# Patient Record
Sex: Male | Born: 1965 | Race: White | Hispanic: No | Marital: Married | Smoking: Former smoker
Health system: Southern US, Community
[De-identification: ages and names within clinical notes are randomized; demographics above are authoritative.]

## PROBLEM LIST (undated history)

## (undated) DIAGNOSIS — S82141A Displaced bicondylar fracture of right tibia, initial encounter for closed fracture: Secondary | ICD-10-CM

## (undated) HISTORY — PX: APPENDECTOMY: SHX54

---

## 1998-01-16 ENCOUNTER — Inpatient Hospital Stay (HOSPITAL_COMMUNITY): Admission: EM | Admit: 1998-01-16 | Discharge: 1998-01-17 | Payer: Self-pay | Admitting: Emergency Medicine

## 1998-01-16 ENCOUNTER — Encounter: Payer: Self-pay | Admitting: Emergency Medicine

## 1998-11-28 ENCOUNTER — Emergency Department (HOSPITAL_COMMUNITY): Admission: EM | Admit: 1998-11-28 | Discharge: 1998-11-28 | Payer: Self-pay | Admitting: Emergency Medicine

## 1998-11-28 ENCOUNTER — Encounter: Payer: Self-pay | Admitting: Emergency Medicine

## 2005-08-17 ENCOUNTER — Inpatient Hospital Stay (HOSPITAL_COMMUNITY): Admission: EM | Admit: 2005-08-17 | Discharge: 2005-08-18 | Payer: Self-pay | Admitting: Emergency Medicine

## 2012-01-09 ENCOUNTER — Emergency Department (HOSPITAL_COMMUNITY)
Admission: EM | Admit: 2012-01-09 | Discharge: 2012-01-09 | Disposition: A | Discharge status: Home / Self Care | Payer: Self-pay | Attending: Emergency Medicine | Admitting: Emergency Medicine

## 2012-01-09 ENCOUNTER — Encounter (HOSPITAL_COMMUNITY): Payer: Self-pay

## 2012-01-09 DIAGNOSIS — S61209A Unspecified open wound of unspecified finger without damage to nail, initial encounter: Secondary | ICD-10-CM | POA: Insufficient documentation

## 2012-01-09 DIAGNOSIS — W230XXA Caught, crushed, jammed, or pinched between moving objects, initial encounter: Secondary | ICD-10-CM | POA: Insufficient documentation

## 2012-01-09 DIAGNOSIS — Y929 Unspecified place or not applicable: Secondary | ICD-10-CM | POA: Insufficient documentation

## 2012-01-09 DIAGNOSIS — S61219A Laceration without foreign body of unspecified finger without damage to nail, initial encounter: Secondary | ICD-10-CM

## 2012-01-09 DIAGNOSIS — Y939 Activity, unspecified: Secondary | ICD-10-CM | POA: Insufficient documentation

## 2012-01-09 DIAGNOSIS — Z23 Encounter for immunization: Secondary | ICD-10-CM | POA: Insufficient documentation

## 2012-01-09 MED ORDER — TETANUS-DIPHTH-ACELL PERTUSSIS 5-2.5-18.5 LF-MCG/0.5 IM SUSP
0.5000 mL | Freq: Once | INTRAMUSCULAR | Status: AC
  Administered 2012-01-09: 0.5 mL via INTRAMUSCULAR
  Filled 2012-01-09: qty 0.5

## 2012-01-09 NOTE — ED Notes (Signed)
Pt got ring finger caught in ladder laceration to the underside of left right finger, swelling, bleeding controlled

## 2012-01-09 NOTE — ED Provider Notes (Signed)
History     CSN: [id number]  Arrival date & time 01/09/12  4696   First MD Initiated Contact with Patient 01/09/12 [phone]      Chief Complaint  Patient presents with  . Hand Injury    (Consider location/radiation/quality/duration/timing/severity/associated sxs/prior treatment) HPI Comments: 46 year old male presents the emergency department with a laceration to his left ring finger after having his finger caught on a ladder about an hour and a half ago. The ladder latched on to his ring causing it to cut his finger. States his finger is getting a little tingly but not numb yet. Denies nausea or lightheadedness. He was able to control the bleeding. Last tetanus shot over 10 years ago.  Patient is a 46 y.o. male presenting with hand injury. The history is provided by the patient and the spouse.  Hand Injury     History reviewed. No pertinent past medical history.  Past Surgical History  Procedure Date  . Appendectomy     History reviewed. No pertinent family history.  History  Substance Use Topics  . Smoking status: Never Smoker   . Smokeless tobacco: Not on file  . Alcohol Use: Yes      Review of Systems  Gastrointestinal: Negative for nausea.  Skin: Positive for wound.  Neurological: Negative for light-headedness.  Hematological: Does not bruise/bleed easily.    Allergies  Review of patient's allergies indicates no known allergies.  Home Medications  No current outpatient prescriptions on file.  BP 124/74  Pulse 72  Temp 97.8 F (36.6 C) (Oral)  Resp 20  SpO2 99%  Physical Exam  Constitutional: He is oriented to person, place, and time. He appears well-developed and well-nourished. No distress.  HENT:  Head: Normocephalic and atraumatic.  Eyes: Conjunctivae normal are normal.  Neck: Normal range of motion.  Cardiovascular: Normal rate, regular rhythm and normal heart sounds.   Pulmonary/Chest: Effort normal and breath sounds normal.  Musculoskeletal:         Hands: Neurological: He is alert and oriented to person, place, and time. He has normal strength. No sensory deficit.  Skin:       See MSS exam findings  Psychiatric: He has a normal mood and affect. His behavior is normal.    ED Course  Procedures (including critical care time) LACERATION REPAIR Performed by: Johnnette Gourd Authorized by: Johnnette Gourd Consent: Verbal consent obtained. Risks and benefits: risks, benefits and alternatives were discussed Consent given by: patient Patient identity confirmed: provided demographic data Prepped and Draped in normal sterile fashion Wound explored  Laceration Location: left 4th digit  Laceration Length: 1.5 cm  No Foreign Bodies seen or palpated  Anesthesia: local infiltration  Local anesthetic: lidocaine 2% without epinephrine  Anesthetic total: 3 ml  Irrigation method: syringe Amount of cleaning: standard  Skin closure: 4-0 prolene  Number of sutures: 7  Technique: simple interrupted  Patient tolerance: Patient tolerated the procedure well with no immediate complications.  Labs Reviewed - No data to display No results found.   1. Finger laceration       MDM  46 y/o male with finger laceration. Wedding band removed. Tdap given. No evidence of tendinous disruption. 7 sutures placed without problem. Proper wound care discussed.         Trevor Mace, PA-C 01/09/12 1036

## 2012-01-09 NOTE — ED Notes (Signed)
Wedding ring cut off. Two pieces cleaned and given to patient's wife. Patient tolerated well.

## 2012-01-09 NOTE — ED Provider Notes (Signed)
Medical screening examination/treatment/procedure(s) were performed by non-physician practitioner and as supervising physician I was immediately available for consultation/collaboration.   Celene Kras, MD 01/09/12 1040

## 2012-01-20 ENCOUNTER — Emergency Department (HOSPITAL_COMMUNITY)
Admission: EM | Admit: 2012-01-20 | Discharge: 2012-01-20 | Disposition: A | Discharge status: Home / Self Care | Payer: Self-pay | Source: Home / Self Care

## 2013-11-04 ENCOUNTER — Emergency Department (HOSPITAL_COMMUNITY)
Admission: EM | Admit: 2013-11-04 | Discharge: 2013-11-04 | Disposition: A | Discharge status: Home / Self Care | Payer: BC Managed Care – PPO | Attending: Emergency Medicine | Admitting: Emergency Medicine

## 2013-11-04 ENCOUNTER — Encounter (HOSPITAL_COMMUNITY): Payer: Self-pay | Admitting: Emergency Medicine

## 2013-11-04 DIAGNOSIS — M25439 Effusion, unspecified wrist: Secondary | ICD-10-CM | POA: Diagnosis present

## 2013-11-04 DIAGNOSIS — Z79899 Other long term (current) drug therapy: Secondary | ICD-10-CM | POA: Insufficient documentation

## 2013-11-04 DIAGNOSIS — M71022 Abscess of bursa, left elbow: Secondary | ICD-10-CM

## 2013-11-04 DIAGNOSIS — M6789 Other specified disorders of synovium and tendon, multiple sites: Secondary | ICD-10-CM | POA: Insufficient documentation

## 2013-11-04 MED ORDER — PREDNISONE 20 MG PO TABS
60.0000 mg | ORAL_TABLET | Freq: Once | ORAL | Status: AC
  Administered 2013-11-04: 60 mg via ORAL
  Filled 2013-11-04: qty 3

## 2013-11-04 MED ORDER — PREDNISONE 10 MG PO TABS: 20.0000 mg | ORAL_TABLET | Freq: Every day | ORAL | Status: DC

## 2013-11-04 NOTE — ED Notes (Signed)
Pt reports that on July 1st he was working outside and a wasp stung him on his left below. Pt reports that he had a large area of swelling afterwards which lasted for three days. Pt reports that there has had some mild swelling since. Pt states that today the area began to re-swell. Pt is A/O x4, in NAD, and vitals are WDL.

## 2013-11-04 NOTE — ED Provider Notes (Signed)
CSN: [id number]     Arrival date & time 11/04/13  1917 History   First MD Initiated Contact with Patient 11/04/13 1925    This chart was scribed for nurse practitioner Junius Creamer, NP working with Threasa Beards, MD, by Thea Alken, ED Scribe. This patient was seen in room [facility] and the patient's care was started at 7:51 PM. Chief Complaint  Patient presents with  . Joint Swelling   The history is provided by the patient. No language interpreter was used.   Gordon Johnson is a 48 y.o. male who presents to the Emergency Department complaining of left elbow swelling. Pt reports he was stung by a wasp to his left elbow about 2 month ago but has had persisting swelling. States the swelling went subsided for some time but worsened today. Pt denies pain to elbow. Pt is a Nature conservation officer.  History reviewed. No pertinent past medical history. Past Surgical History  Procedure Laterality Date  . Appendectomy     No family history on file. History  Substance Use Topics  . Smoking status: Never Smoker   . Smokeless tobacco: Not on file  . Alcohol Use: Yes    Review of Systems  Constitutional: Negative for fever and chills.  Musculoskeletal: Positive for joint swelling.  Skin: Negative for wound.  Neurological: Negative for numbness.  All other systems reviewed and are negative.   Allergies  Review of patient's allergies indicates no known allergies.  Home Medications   Prior to Admission medications   Medication Sig Start Date End Date Taking? Authorizing Provider  predniSONE (DELTASONE) 10 MG tablet Take 2 tablets (20 mg total) by mouth daily. 11/04/13   Garald Balding, NP   BP 136/78  Pulse 84  Temp(Src) 98.5 F (36.9 C) (Oral)  Resp 18  SpO2 100% Physical Exam  Nursing note and vitals reviewed. Constitutional: He is oriented to person, place, and time. He appears well-developed and well-nourished. No distress.  HENT:  Head: Normocephalic and atraumatic.  Eyes:  Conjunctivae and EOM are normal.  Neck: Neck supple.  Cardiovascular: Normal rate and intact distal pulses.   Pulmonary/Chest: Effort normal.  Musculoskeletal: Normal range of motion.       Left elbow: He exhibits swelling. He exhibits normal range of motion, no effusion, no deformity and no laceration. No olecranon process tenderness noted.  Swelling over the olecranon process without erythema or pain   Neurological: He is alert and oriented to person, place, and time.  Skin: Skin is warm and dry. No erythema.  Bursa to the left elbow. No increased heat.   Psychiatric: He has a normal mood and affect. His behavior is normal.    ED Course  Procedures  DIAGNOSTIC STUDIES: Oxygen Saturation is 100% on RA, normal by my interpretation.    COORDINATION OF CARE: 8:10 PM- Pt advised of plan for treatment and pt agrees.  Labs Review Labs Reviewed - No data to display  Imaging Review No results found.   EKG Interpretation None      MDM   Final diagnoses:  Olecranon bursa abscess, left    I personally performed the services described in this documentation, which was scribed in my presence. The recorded information has been reviewed and is accurate.      Garald Balding, NP 11/04/13 2010

## 2013-11-04 NOTE — ED Provider Notes (Signed)
Medical screening examination/treatment/procedure(s) were performed by non-physician practitioner and as supervising physician I was immediately available for consultation/collaboration.   EKG Interpretation None       Threasa Beards, MD 11/04/13 2016

## 2014-10-18 ENCOUNTER — Emergency Department (HOSPITAL_COMMUNITY): Payer: BLUE CROSS/BLUE SHIELD

## 2014-10-18 ENCOUNTER — Emergency Department (HOSPITAL_COMMUNITY)
Admission: EM | Admit: 2014-10-18 | Discharge: 2014-10-18 | Disposition: A | Discharge status: Home / Self Care | Payer: BLUE CROSS/BLUE SHIELD | Attending: Emergency Medicine | Admitting: Emergency Medicine

## 2014-10-18 ENCOUNTER — Encounter (HOSPITAL_COMMUNITY): Payer: Self-pay | Admitting: *Deleted

## 2014-10-18 DIAGNOSIS — Y998 Other external cause status: Secondary | ICD-10-CM | POA: Diagnosis not present

## 2014-10-18 DIAGNOSIS — Z87891 Personal history of nicotine dependence: Secondary | ICD-10-CM | POA: Insufficient documentation

## 2014-10-18 DIAGNOSIS — Y9289 Other specified places as the place of occurrence of the external cause: Secondary | ICD-10-CM | POA: Diagnosis not present

## 2014-10-18 DIAGNOSIS — W11XXXA Fall on and from ladder, initial encounter: Secondary | ICD-10-CM | POA: Insufficient documentation

## 2014-10-18 DIAGNOSIS — S8992XA Unspecified injury of left lower leg, initial encounter: Secondary | ICD-10-CM | POA: Insufficient documentation

## 2014-10-18 DIAGNOSIS — Z7952 Long term (current) use of systemic steroids: Secondary | ICD-10-CM | POA: Diagnosis not present

## 2014-10-18 DIAGNOSIS — S82101A Unspecified fracture of upper end of right tibia, initial encounter for closed fracture: Secondary | ICD-10-CM | POA: Insufficient documentation

## 2014-10-18 DIAGNOSIS — S8991XA Unspecified injury of right lower leg, initial encounter: Secondary | ICD-10-CM | POA: Diagnosis present

## 2014-10-18 DIAGNOSIS — Y9389 Activity, other specified: Secondary | ICD-10-CM | POA: Insufficient documentation

## 2014-10-18 DIAGNOSIS — S82141A Displaced bicondylar fracture of right tibia, initial encounter for closed fracture: Secondary | ICD-10-CM

## 2014-10-18 MED ORDER — MORPHINE SULFATE (PF) 4 MG/ML IV SOLN
4.0000 mg | Freq: Once | INTRAVENOUS | Status: AC
  Administered 2014-10-18: 4 mg via INTRAMUSCULAR
  Filled 2014-10-18: qty 1

## 2014-10-18 MED ORDER — METHOCARBAMOL 500 MG PO TABS: 500.0000 mg | ORAL_TABLET | Freq: Two times a day (BID) | ORAL | Status: DC

## 2014-10-18 MED ORDER — NAPROXEN 500 MG PO TABS
500.0000 mg | ORAL_TABLET | Freq: Once | ORAL | Status: AC
  Administered 2014-10-18: 500 mg via ORAL
  Filled 2014-10-18: qty 1

## 2014-10-18 MED ORDER — OXYCODONE-ACETAMINOPHEN 5-325 MG PO TABS: 2.0000 | ORAL_TABLET | ORAL | Status: DC | PRN

## 2014-10-18 MED ORDER — NAPROXEN 500 MG PO TABS: 500.0000 mg | ORAL_TABLET | Freq: Two times a day (BID) | ORAL | Status: DC

## 2014-10-18 NOTE — ED Notes (Signed)
Pt reports he fell 3 ft from a ladder, but as he was falling, his R leg got caught in the ladder.  Pt reports R knee pain, unable to straighten his R knee.  He states he heard a "pop."

## 2014-10-18 NOTE — ED Provider Notes (Signed)
CSN: [id number]     Arrival date & time 10/18/14  1529 History  This chart was scribed for non-physician practitioner, Ottie Glazier, PA-C working with Dorie Rank, MD by Rayna Sexton, ED scribe. This patient was seen in room WTR8/WTR8 and the patient's care was started at 4:10 PM.   Chief Complaint  Patient presents with  . Fall  . Knee Pain   The history is provided by the patient. No language interpreter was used.    HPI Comments: Gordon Johnson is a 49 y.o. male who presents to the Emergency Department complaining of a fall that occurred PTA. Pt notes falling from a 63ft ladder and before hitting the ground further notes his right leg caught between the steps in the ladder resulting in a "pop" in his right knee. He notes associated, moderate, pain and swelling that worsens with movement and a decreased ROM to the affected knee. 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 He denies any surgery to the affected knee. Pt denies any numbness.   History reviewed. No pertinent past medical history. Past Surgical History  Procedure Laterality Date  . Appendectomy     No family history on file. Social History  Substance Use Topics  . Smoking status: Former Research scientist (life sciences)  . Smokeless tobacco: None  . Alcohol Use: Yes    Review of Systems  Musculoskeletal: Positive for joint swelling and arthralgias.  Skin: Negative for color change and wound.  Neurological: Negative for numbness.   Allergies  Review of patient's allergies indicates no known allergies.  Home Medications   Prior to Admission medications   Medication Sig Start Date End Date Taking? Authorizing Provider  oxyCODONE-acetaminophen (PERCOCET/ROXICET) 5-325 MG per tablet Take 2 tablets by mouth every 4 (four) hours as needed for severe pain. 10/18/14   Lorenda Grecco Patel-Mills, PA-C  predniSONE (DELTASONE) 10 MG tablet Take 2 tablets (20 mg total) by mouth daily. 11/04/13   Junius Creamer, NP   BP 113/72 mmHg   Pulse 66  Temp(Src) 97.9 F (36.6 C) (Oral)  Resp 18  SpO2 97% Physical Exam  Constitutional: He is oriented to person, place, and time. He appears well-developed and well-nourished. No distress.  HENT:  Head: Normocephalic and atraumatic.  Mouth/Throat: No oropharyngeal exudate.  Eyes: Right eye exhibits no discharge. Left eye exhibits no discharge.  Neck: Normal range of motion. No tracheal deviation present.  Cardiovascular: Normal rate and intact distal pulses.   Pulses:      Dorsalis pedis pulses are 2+ on the right side.  Pulmonary/Chest: Effort normal. No respiratory distress.  Abdominal: Soft. There is no tenderness.  Musculoskeletal:       Left knee: He exhibits decreased range of motion, swelling and effusion. He exhibits no ecchymosis and no erythema. Tenderness found.  Moderate joint effusion to the right knee; unable to straight leg raise; less than 2 second capillary refill; NVI  Neurological: He is alert and oriented to person, place, and time.  Skin: Skin is warm and dry. He is not diaphoretic.  Psychiatric: He has a normal mood and affect. His behavior is normal.  Nursing note and vitals reviewed.  ED Course  Procedures  DIAGNOSTIC STUDIES: Oxygen Saturation is 100% on RA, normal by my interpretation.    COORDINATION OF CARE: 4:14 PM Discussed treatment plan with pt at bedside including an x-ray to the right knee and a dosage of pain medication. Pt agreed to plan.  Labs Review Labs Reviewed - No data to display  Imaging Review Dg Knee Complete 4 Views Right  10/18/2014   CLINICAL DATA:  Pain following fall  EXAM: RIGHT KNEE - COMPLETE 4+ VIEW  COMPARISON:  None.  FINDINGS: Frontal, lateral, and bilateral oblique views were obtained. There is a comminuted fracture of the medial tibial plateaus with several displaced fracture fragments. No other fractures. No dislocation. There is a fairly sizable joint effusion. No erosive change.  IMPRESSION: Comminuted fracture  of the medial tibial plateau with several displaced fracture fragments. Moderate joint effusion. No dislocation.   Electronically Signed   By: Lowella Grip III M.D.   On: 10/18/2014 16:12   I have personally reviewed and evaluated these images and lab results as part of my medical decision-making.   EKG Interpretation None      MDM   Final diagnoses:  Tibial plateau fracture, right, closed, initial encounter  Patient presents for right knee pain after fall from a ladder. X-ray of the knee shows comminuted fracture of the medial tibial plateau with several displaced fracture fragments. He has a moderate joint effusion but no dislocation.I discussed this patient with Dr. Trevor Mace PA who recommended CT of the knee to determine if this was surgical or not. He also recommended a knee immobilizer, to caution the patient about compartment syndrome, and to keep the patient nonweightbearing until he is evaluated by orthopedics. I will also give the patient Percocet for pain. I discussed the plan with the patient and he verbally agrees.  I personally performed the services described in this documentation, which was scribed in my presence. The recorded information has been reviewed and is accurate.   Ottie Glazier, PA-C 10/18/14 1743  Dorie Rank, MD 10/21/14 [id number]

## 2014-10-18 NOTE — ED Notes (Addendum)
See triage note.  Hematoma noted in medial R knee.

## 2014-10-18 NOTE — Discharge Instructions (Signed)
Tibial Fracture, Adult Follow up with orthopedics on Monday. You have a fracture (break in bone) of your tibia. This is the large "shin" bone in your lower leg. These fractures are easily diagnosed with x-rays. TREATMENT  You have a simple fracture which usually will heal without disability. It can be treated with simple immobilization. This means the bone can be held with a cast or splint in a favorable position until your caregiver feels it is stable (healed well enough). Then you can begin range of motion exercises to keep your knee and ankle limber (moving well). HOME CARE INSTRUCTIONS   Apply ice to the injury for 15-20 minutes, 03-04 times per day while awake, for 2 days. Put the ice in a plastic bag and place a thin towel between the bag of ice and your cast.  If you have a plaster or fiberglass cast:  Do not try to scratch the skin under the cast using sharp or pointed objects.  Check the skin around the cast every day. You may put lotion on any red or sore areas.  Keep your cast dry and clean.  If you have a plaster splint:  Wear the splint as directed.  You may loosen the elastic around the splint if your toes become numb, tingle, or turn cold or blue.  Do not put pressure on any part of your cast or splint until it is fully hardened.  Your cast or splint can be protected during bathing with a plastic bag. Do not lower the cast or splint into water.  Use crutches as directed.  Only take over-the-counter or prescription medicines for pain, discomfort, or fever as directed by your caregiver.  See your caregiver as directed. It is very important to keep all follow-up referrals and appointments in order to avoid any long-term problems with your leg and ankle including chronic pain, inability to move the ankle normally, failure of the fracture to heal and permanent disability. SEEK IMMEDIATE MEDICAL CARE IF:   Pain is becoming worse rather than better, or if pain is  uncontrolled with medications.  You have increased swelling, pain, or redness in the foot.  You begin to lose feeling in your foot or toes.  You develop a cold or blue foot or toes on the injured side.  You develop severe pain in your injured leg, especially if it is increased with movement of your toes. Document Released: 11/11/2000 Document Revised: 05/11/2011 Document Reviewed: 04/12/2013 [facility] Patient Information 2015 [place], [place]. This information is not intended to replace advice given to you by your health care provider. Make sure you discuss any questions you have with your health care provider.   Knee Bracing Knee braces are supports to help stabilize and protect an injured or painful knee. They come in many different styles. They should support and protect the knee without increasing the chance of other injuries to yourself or others. It is important not to have a false sense of security when using a brace. Knee braces that help you to keep using your knee:  Do not restore normal knee stability under high stress forces.  May decrease some aspects of athletic performance. Some of the different types of knee braces are:  Prophylactic knee braces are designed to prevent or reduce the severity of knee injuries during sports that make injury to the knee more likely.  Rehabilitative knee braces are designed to allow protected motion of:  Injured knees.  Knees that have been treated with or without surgery.  There is no evidence that the use of a supportive knee brace protects the graft following a successful anterior cruciate ligament (ACL) reconstruction. However, braces are sometimes used to:   Protect injured ligaments.  Control knee movement during the initial healing period. They may be used as part of the treatment program for the various injured ligaments or cartilage of the knee including the:  Anterior cruciate ligament.  Medial collateral ligament.  Medial or  lateral cartilage (meniscus).  Posterior cruciate ligament.  Lateral collateral ligament. Rehabilitative knee braces are most commonly used:  During crutch-assisted walking right after injury.  During crutch-assisted walking right after surgery to repair the cartilage and/or cruciate ligament injury.  For a short period of time, 2-8 weeks, after the injury or surgery. The value of a rehabilitative brace as opposed to a cast or splint includes the:  Ability to adjust the brace for swelling.  Ability to remove the brace for examinations, icing, or showering.  Ability to allow for movement in a controlled range of motion. Functional knee braces give support to knees that have already been injured. They are designed to provide stability for the injured knee and provide protection after repair. Functional knee braces may not affect performance much. Lower extremity muscle strengthening, flexibility, and improvement in technique are more important than bracing in treating ligamentous knee injuries. Functional braces are not a substitute for rehabilitation or surgical procedures. Unloader/off-loader braces are designed to provide pain relief in arthritic knees. Patients with wear and tear arthritis from growing old or from an old cartilage injury (osteoarthritis) of the knee, and bowlegged (varus) or knock-knee (valgus) deformities, often develop increased pain in the arthritic side due to increased loading. Unloader/off-loader braces are made to reduce uneven loading in such knees. There is reduction in bowing out movement in bowlegged knees when the correct unloader brace is used. Patients with advanced osteoarthritis or severe varus or valgus alignment problems would not likely benefit from bracing. Patellofemoral braces help the kneecap to move smoothly and well centered over the end of the femur in the knee.  Most people who wear knee braces feel that they help. However, there is a lack of  scientific evidence that knee braces are helpful at the level needed for athletic participation to prevent injury. In spite of this, athletes report an increase in knee stability, pain relief, performance improvement, and confidence during athletics when using a brace.  Different knee problems require different knee braces:  Your caregiver may suggest one kind of knee brace after knee surgery.  A caregiver may choose another kind of knee brace for support instead of surgery for some types of torn ligaments.  You may also need one for pain in the front of your knee that is not getting better with strengthening and flexibility exercises. Get your caregiver's advice if you want to try a knee brace. The caregiver will advise you on where to get them and provide a prescription when it is needed to fashion and/or fit the brace. Knee braces are the least important part of preventing knee injuries or getting better following injury. Stretching, strengthening and technique improvement are far more important in caring for and preventing knee injuries. When strengthening your knee, increase your activities a little at a time so as not to develop injuries from overuse. Work out an exercise plan with your caregiver and/or physical therapist to get the best program for you. Do not let a knee brace become a crutch. Always remember, there are no  braces which support the knee as well as your original ligaments and cartilage you were born with. Conditioning, proper warm-up, and stretching remain the most important parts of keeping your knees healthy. HOW TO USE A KNEE BRACE  During sports, knee braces should be used as directed by your caregiver.  Make sure that the hinges are where the knee bends.  Straps, tapes, or hook-and-loop tapes should be fastened around your leg as instructed.  You should check the placement of the brace during activities to make sure that it has not moved. Poorly positioned braces can  hurt rather than help you.  To work well, a knee brace should be worn during all activities that put you at risk of knee injury.  Warm up properly before beginning athletic activities. HOME CARE INSTRUCTIONS  Knee braces often get damaged during normal use. Replace worn-out braces for maximum benefit.  Clean regularly with soap and water.  Inspect your brace often for wear and tear.  Cover exposed metal to protect others from injury.  Durable materials may cost more, but last longer. SEEK IMMEDIATE MEDICAL CARE IF:   Your knee seems to be getting worse rather than better.  You have increasing pain or swelling in the knee.  You have problems caused by the knee brace.  You have increased swelling or inflammation (redness or soreness) in your knee.  Your knee becomes warm and more painful and you develop an unexplained temperature over 101F (38.3C). MAKE SURE YOU:   Understand these instructions.  Will watch your condition.  Will get help right away if you are not doing well or get worse. See your caregiver, physical therapist, or orthopedic surgeon for additional information. Document Released: 05/09/2003 Document Revised: 07/03/2013 Document Reviewed: 08/15/2008 [facility] Patient Information 2015 [place], [place]. This information is not intended to replace advice given to you by your health care provider. Make sure you discuss any questions you have with your health care provider.

## 2014-10-23 NOTE — H&P (Signed)
  ORTHOPAEDIC HISTORY & PHYSICAL  Gordon Johnson MRN:  [id number] DOB/SEX:  March 02, 1966/male  CHIEF COMPLAINT:  Painful right TIBIAL PLATAEU  HISTORY:REASON FOR VISIT:  New patient with a right tibial plateau fracture referred from [facility] Date of injury:  10/18/2014.         He was on a step-ladder when he fell off landing on his right leg feeling immediate pop and pain.  He had x-rays and CT scan which demonstrated medial sided isolated tibial plateau fracture.        PAST MEDICAL HISTORY: There are no active problems to display for this patient.  No past medical history on file. Past Surgical History  Procedure Laterality Date  . Appendectomy       MEDICATIONS:   No prescriptions prior to admission    ALLERGIES:  No Known Allergies  REVIEW OF SYSTEMS:  Musculoskeletal:positive for His compartments are soft. Minimal ecchymosis with mild swelling.  Distally he is neurovascularly intact. Patient confirms pain to all movement right leg/knee.   FAMILY HISTORY:  No family history on file.  SOCIAL HISTORY:   Social History  Substance Use Topics  . Smoking status: Former Research scientist (life sciences)  . Smokeless tobacco: Not on file  . Alcohol Use: Yes      EXAMINATION: Vital signs in last 24 hours:    Head is normocephalic.   Eyes:  Pupils equal, round and reactive to light and accommodation.  Extraocular intact. ENT: Ears, nose, and throat were benign.   Neck: supple, no bruits were noted.   Chest: good expansion.   Lungs: essentially clear.   Cardiac: regular rhythm and rate, normal S1, S2.  No murmurs appreciated. Pulses :  2+ bilateral and symmetric in bilateral extremities. Abdomen is scaphoid, soft, nontender, no masses palpable, normal bowel sounds                  present. CNS:  He is oriented x3 and cranial nerves II-XII grossly intact. Breast, rectal, and genital exams: not performed and not indicated for an orthopedic evaluation. Musculoskeletal: Musculoskeletal:positive  for His compartments are soft. Minimal ecchymosis with mild swelling.  Distally he is neurovascularly intact.    Imaging Review  medial plateau fracture with significant displacement.   ASSESSMENT: right tibial plateau fracture  No past medical history on file.  PLAN: Plan for right tibial plateau open reduction internal fixation  The procedure,  risks, and benefits of surgery were presented and reviewed. The risks including but not limited to infection, blood clots, vascular and nerve injury, stiffness,  among others were discussed. The patient acknowledged the explanation, agreed to proceed.   Buck Mam, PA-C  [phone]  10/23/2014 5:46 PM

## 2014-10-24 ENCOUNTER — Encounter (HOSPITAL_BASED_OUTPATIENT_CLINIC_OR_DEPARTMENT_OTHER): Payer: Self-pay | Admitting: *Deleted

## 2014-10-25 ENCOUNTER — Ambulatory Visit (HOSPITAL_BASED_OUTPATIENT_CLINIC_OR_DEPARTMENT_OTHER): Payer: BLUE CROSS/BLUE SHIELD | Admitting: Anesthesiology

## 2014-10-25 ENCOUNTER — Encounter (HOSPITAL_BASED_OUTPATIENT_CLINIC_OR_DEPARTMENT_OTHER): Payer: Self-pay | Admitting: Anesthesiology

## 2014-10-25 ENCOUNTER — Ambulatory Visit (HOSPITAL_COMMUNITY): Payer: BLUE CROSS/BLUE SHIELD

## 2014-10-25 ENCOUNTER — Encounter (HOSPITAL_BASED_OUTPATIENT_CLINIC_OR_DEPARTMENT_OTHER)
Admission: RE | Disposition: A | Discharge status: Home / Self Care | Payer: Self-pay | Source: Ambulatory Visit | Attending: Orthopedic Surgery

## 2014-10-25 ENCOUNTER — Ambulatory Visit (HOSPITAL_BASED_OUTPATIENT_CLINIC_OR_DEPARTMENT_OTHER)
Admission: RE | Admit: 2014-10-25 | Discharge: 2014-10-26 | Disposition: A | Discharge status: Home / Self Care | Payer: BLUE CROSS/BLUE SHIELD | Source: Ambulatory Visit | Attending: Orthopedic Surgery | Admitting: Orthopedic Surgery

## 2014-10-25 DIAGNOSIS — S83206A Unspecified tear of unspecified meniscus, current injury, right knee, initial encounter: Secondary | ICD-10-CM | POA: Insufficient documentation

## 2014-10-25 DIAGNOSIS — S82209A Unspecified fracture of shaft of unspecified tibia, initial encounter for closed fracture: Secondary | ICD-10-CM | POA: Diagnosis present

## 2014-10-25 DIAGNOSIS — S82141A Displaced bicondylar fracture of right tibia, initial encounter for closed fracture: Secondary | ICD-10-CM | POA: Insufficient documentation

## 2014-10-25 DIAGNOSIS — X58XXXA Exposure to other specified factors, initial encounter: Secondary | ICD-10-CM | POA: Diagnosis not present

## 2014-10-25 DIAGNOSIS — Z4789 Encounter for other orthopedic aftercare: Secondary | ICD-10-CM

## 2014-10-25 DIAGNOSIS — Z87891 Personal history of nicotine dependence: Secondary | ICD-10-CM | POA: Insufficient documentation

## 2014-10-25 HISTORY — PX: ORIF TIBIA PLATEAU: SHX2132

## 2014-10-25 HISTORY — DX: Displaced bicondylar fracture of right tibia, initial encounter for closed fracture: S82.141A

## 2014-10-25 LAB — POCT HEMOGLOBIN-HEMACUE: HEMOGLOBIN: 16.3 g/dL (ref 13.0–17.0)

## 2014-10-25 SURGERY — OPEN REDUCTION INTERNAL FIXATION (ORIF) TIBIAL PLATEAU
Anesthesia: General | Laterality: Right

## 2014-10-25 MED ORDER — HYDROMORPHONE HCL 1 MG/ML IJ SOLN
INTRAMUSCULAR | Status: AC
  Filled 2014-10-25: qty 1

## 2014-10-25 MED ORDER — OXYCODONE HCL 5 MG PO TABS
5.0000 mg | ORAL_TABLET | ORAL | Status: DC | PRN
  Administered 2014-10-25 – 2014-10-26 (×6): 10 mg via ORAL
  Filled 2014-10-25 (×6): qty 2

## 2014-10-25 MED ORDER — HYDROMORPHONE HCL 1 MG/ML IJ SOLN
0.2500 mg | INTRAMUSCULAR | Status: DC | PRN
  Administered 2014-10-25 (×3): 0.5 mg via INTRAVENOUS

## 2014-10-25 MED ORDER — MIDAZOLAM HCL 2 MG/2ML IJ SOLN
INTRAMUSCULAR | Status: AC
  Filled 2014-10-25: qty 2

## 2014-10-25 MED ORDER — METOCLOPRAMIDE HCL 5 MG/ML IJ SOLN: 5.0000 mg | Freq: Three times a day (TID) | INTRAMUSCULAR | Status: DC | PRN

## 2014-10-25 MED ORDER — DEXAMETHASONE SODIUM PHOSPHATE 10 MG/ML IJ SOLN
INTRAMUSCULAR | Status: DC | PRN
  Administered 2014-10-25: 10 mg via INTRAVENOUS

## 2014-10-25 MED ORDER — GLYCOPYRROLATE 0.2 MG/ML IJ SOLN: 0.2000 mg | Freq: Once | INTRAMUSCULAR | Status: DC | PRN

## 2014-10-25 MED ORDER — CHLORHEXIDINE GLUCONATE 4 % EX LIQD: 60.0000 mL | Freq: Once | CUTANEOUS | Status: DC

## 2014-10-25 MED ORDER — SCOPOLAMINE 1 MG/3DAYS TD PT72: 1.0000 | MEDICATED_PATCH | Freq: Once | TRANSDERMAL | Status: DC | PRN

## 2014-10-25 MED ORDER — DOCUSATE SODIUM 100 MG PO CAPS: 100.0000 mg | ORAL_CAPSULE | Freq: Two times a day (BID) | ORAL | Status: DC

## 2014-10-25 MED ORDER — HYDROMORPHONE HCL 1 MG/ML IJ SOLN
1.0000 mg | INTRAMUSCULAR | Status: DC | PRN
  Administered 2014-10-25 (×2): 1 mg via INTRAVENOUS
  Administered 2014-10-25 (×2): 0.5 mg via INTRAVENOUS
  Administered 2014-10-26 (×2): 1 mg via INTRAVENOUS
  Filled 2014-10-25 (×5): qty 1

## 2014-10-25 MED ORDER — METOCLOPRAMIDE HCL 5 MG PO TABS: 5.0000 mg | ORAL_TABLET | Freq: Three times a day (TID) | ORAL | Status: DC | PRN

## 2014-10-25 MED ORDER — 0.9 % SODIUM CHLORIDE (POUR BTL) OPTIME
TOPICAL | Status: DC | PRN
  Administered 2014-10-25: 300 mL

## 2014-10-25 MED ORDER — ONDANSETRON HCL 4 MG/2ML IJ SOLN
INTRAMUSCULAR | Status: DC | PRN
  Administered 2014-10-25: 4 mg via INTRAVENOUS

## 2014-10-25 MED ORDER — FENTANYL CITRATE (PF) 100 MCG/2ML IJ SOLN
INTRAMUSCULAR | Status: AC
  Filled 2014-10-25: qty 6

## 2014-10-25 MED ORDER — DEXTROSE-NACL 5-0.45 % IV SOLN
INTRAVENOUS | Status: AC
Start: 2014-10-25 — End: 2014-10-26
  Administered 2014-10-25: 100 mL/h via INTRAVENOUS

## 2014-10-25 MED ORDER — ASPIRIN EC 325 MG PO TBEC: 325.0000 mg | DELAYED_RELEASE_TABLET | Freq: Every day | ORAL | Status: DC

## 2014-10-25 MED ORDER — ONDANSETRON HCL 4 MG PO TABS: 4.0000 mg | ORAL_TABLET | Freq: Four times a day (QID) | ORAL | Status: DC | PRN

## 2014-10-25 MED ORDER — ONDANSETRON HCL 4 MG/2ML IJ SOLN: 4.0000 mg | Freq: Once | INTRAMUSCULAR | Status: DC | PRN

## 2014-10-25 MED ORDER — FENTANYL CITRATE (PF) 100 MCG/2ML IJ SOLN: 25.0000 ug | INTRAMUSCULAR | Status: DC | PRN

## 2014-10-25 MED ORDER — LIDOCAINE HCL (CARDIAC) 20 MG/ML IV SOLN
INTRAVENOUS | Status: DC | PRN
  Administered 2014-10-25: 100 mg via INTRAVENOUS

## 2014-10-25 MED ORDER — CEFAZOLIN SODIUM-DEXTROSE 2-3 GM-% IV SOLR
INTRAVENOUS | Status: AC
  Filled 2014-10-25: qty 100

## 2014-10-25 MED ORDER — DEXTROSE 5 % IV SOLN
3.0000 g | INTRAVENOUS | Status: AC
  Administered 2014-10-25: 2 g via INTRAVENOUS

## 2014-10-25 MED ORDER — PROPOFOL 10 MG/ML IV BOLUS
INTRAVENOUS | Status: DC | PRN
  Administered 2014-10-25: 200 mg via INTRAVENOUS

## 2014-10-25 MED ORDER — ACETAMINOPHEN 650 MG RE SUPP: 650.0000 mg | Freq: Four times a day (QID) | RECTAL | Status: DC | PRN

## 2014-10-25 MED ORDER — BUPIVACAINE HCL (PF) 0.5 % IJ SOLN
INTRAMUSCULAR | Status: AC
Start: 2014-10-25 — End: 2014-10-25
  Filled 2014-10-25: qty 30

## 2014-10-25 MED ORDER — CEFAZOLIN SODIUM-DEXTROSE 2-3 GM-% IV SOLR
INTRAVENOUS | Status: AC
  Filled 2014-10-25: qty 50

## 2014-10-25 MED ORDER — MIDAZOLAM HCL 2 MG/2ML IJ SOLN
1.0000 mg | INTRAMUSCULAR | Status: DC | PRN
  Administered 2014-10-25: 2 mg via INTRAVENOUS

## 2014-10-25 MED ORDER — ONDANSETRON HCL 4 MG PO TABS: 4.0000 mg | ORAL_TABLET | Freq: Three times a day (TID) | ORAL | Status: DC | PRN

## 2014-10-25 MED ORDER — ACETAMINOPHEN 325 MG PO TABS: 650.0000 mg | ORAL_TABLET | Freq: Four times a day (QID) | ORAL | Status: DC | PRN

## 2014-10-25 MED ORDER — BUPIVACAINE HCL (PF) 0.25 % IJ SOLN
INTRAMUSCULAR | Status: AC
  Filled 2014-10-25: qty 30

## 2014-10-25 MED ORDER — LACTATED RINGERS IV SOLN
INTRAVENOUS | Status: DC
  Administered 2014-10-25: 10:00:00 via INTRAVENOUS
  Administered 2014-10-25: 10 mL/h via INTRAVENOUS

## 2014-10-25 MED ORDER — PROMETHAZINE HCL 25 MG/ML IJ SOLN: 6.2500 mg | INTRAMUSCULAR | Status: DC | PRN

## 2014-10-25 MED ORDER — BUPIVACAINE-EPINEPHRINE (PF) 0.5% -1:200000 IJ SOLN
INTRAMUSCULAR | Status: DC | PRN
  Administered 2014-10-25: 30 mL via PERINEURAL

## 2014-10-25 MED ORDER — SODIUM CHLORIDE 0.9 % IV SOLN: INTRAVENOUS | Status: DC

## 2014-10-25 MED ORDER — FENTANYL CITRATE (PF) 100 MCG/2ML IJ SOLN
INTRAMUSCULAR | Status: AC
  Filled 2014-10-25: qty 2

## 2014-10-25 MED ORDER — CEFAZOLIN SODIUM-DEXTROSE 2-3 GM-% IV SOLR
2.0000 g | Freq: Four times a day (QID) | INTRAVENOUS | Status: DC
  Administered 2014-10-25 – 2014-10-26 (×2): 2 g via INTRAVENOUS

## 2014-10-25 MED ORDER — OXYCODONE-ACETAMINOPHEN 5-325 MG PO TABS: 2.0000 | ORAL_TABLET | ORAL | Status: DC | PRN

## 2014-10-25 MED ORDER — ONDANSETRON HCL 4 MG/2ML IJ SOLN: 4.0000 mg | Freq: Four times a day (QID) | INTRAMUSCULAR | Status: DC | PRN

## 2014-10-25 MED ORDER — FENTANYL CITRATE (PF) 100 MCG/2ML IJ SOLN
50.0000 ug | INTRAMUSCULAR | Status: AC | PRN
  Administered 2014-10-25 (×2): 50 ug via INTRAVENOUS
  Administered 2014-10-25: 25 ug via INTRAVENOUS
  Administered 2014-10-25: 75 ug via INTRAVENOUS

## 2014-10-25 SURGICAL SUPPLY — 88 items
3.5 X 48MM CORTICAL SCREW (Screw) ×3 IMPLANT
3.5 X 80MM CORTICAL SCREW (Screw) ×3 IMPLANT
4.0 X 60MM LOCKING SCREW (Screw) ×3 IMPLANT
BAG DECANTER FOR FLEXI CONT (MISCELLANEOUS) IMPLANT
BANDAGE ELASTIC 6 VELCRO ST LF (GAUZE/BANDAGES/DRESSINGS) ×3 IMPLANT
BANDAGE ESMARK 6X9 LF (GAUZE/BANDAGES/DRESSINGS) ×1 IMPLANT
BIT DRILL 2.5 (BIT) ×1
BIT DRILL 2.5MM (BIT) ×1
BIT DRILL 3.1 (BIT) ×2 IMPLANT
BIT DRILL 3.1MM (BIT) ×1
BIT DRILL SHRT 216X2.5XNS LF (BIT) ×1 IMPLANT
BIT DRL SHRT 216X2.5XNS LF (BIT) ×1
BLADE SURG 15 STRL LF DISP TIS (BLADE) ×2 IMPLANT
BLADE SURG 15 STRL SS (BLADE) ×4
BNDG COHESIVE 4X5 TAN STRL (GAUZE/BANDAGES/DRESSINGS) ×3 IMPLANT
BNDG ESMARK 6X9 LF (GAUZE/BANDAGES/DRESSINGS) ×3
CANISTER SUCT 1200ML W/VALVE (MISCELLANEOUS) ×3 IMPLANT
CHLORAPREP W/TINT 26ML (MISCELLANEOUS) ×3 IMPLANT
CLOSURE STERI-STRIP 1/2X4 (GAUZE/BANDAGES/DRESSINGS)
CLSR STERI-STRIP ANTIMIC 1/2X4 (GAUZE/BANDAGES/DRESSINGS) IMPLANT
COVER BACK TABLE 60X90IN (DRAPES) ×3 IMPLANT
COVER MAYO STAND STRL (DRAPES) ×3 IMPLANT
CUFF TOURNIQUET SINGLE 34IN LL (TOURNIQUET CUFF) ×3 IMPLANT
DRAPE C-ARM 42X72 X-RAY (DRAPES) ×3 IMPLANT
DRAPE C-ARMOR (DRAPES) ×3 IMPLANT
DRAPE EXTREMITY T 121X128X90 (DRAPE) ×3 IMPLANT
DRAPE U 20/CS (DRAPES) ×3 IMPLANT
DRAPE U-SHAPE 47X51 STRL (DRAPES) ×3 IMPLANT
DRSG EMULSION OIL 3X3 NADH (GAUZE/BANDAGES/DRESSINGS) ×3 IMPLANT
DRSG PAD ABDOMINAL 8X10 ST (GAUZE/BANDAGES/DRESSINGS) ×3 IMPLANT
ELECT REM PT RETURN 9FT ADLT (ELECTROSURGICAL) ×3
ELECTRODE REM PT RTRN 9FT ADLT (ELECTROSURGICAL) ×1 IMPLANT
GAUZE SPONGE 4X4 12PLY STRL (GAUZE/BANDAGES/DRESSINGS) ×3 IMPLANT
GAUZE XEROFORM 1X8 LF (GAUZE/BANDAGES/DRESSINGS) ×3 IMPLANT
GLOVE BIO SURGEON STRL SZ7 (GLOVE) ×3 IMPLANT
GLOVE BIO SURGEON STRL SZ7.5 (GLOVE) ×3 IMPLANT
GLOVE BIO SURGEON STRL SZ8 (GLOVE) IMPLANT
GLOVE BIOGEL PI IND STRL 7.0 (GLOVE) ×1 IMPLANT
GLOVE BIOGEL PI IND STRL 7.5 (GLOVE) ×1 IMPLANT
GLOVE BIOGEL PI IND STRL 8 (GLOVE) ×1 IMPLANT
GLOVE BIOGEL PI INDICATOR 7.0 (GLOVE) ×2
GLOVE BIOGEL PI INDICATOR 7.5 (GLOVE) ×2
GLOVE BIOGEL PI INDICATOR 8 (GLOVE) ×2
GLOVE ECLIPSE 6.5 STRL STRAW (GLOVE) ×3 IMPLANT
GOWN STRL REUS W/ TWL LRG LVL3 (GOWN DISPOSABLE) ×2 IMPLANT
GOWN STRL REUS W/ TWL XL LVL3 (GOWN DISPOSABLE) ×1 IMPLANT
GOWN STRL REUS W/TWL LRG LVL3 (GOWN DISPOSABLE) ×4
GOWN STRL REUS W/TWL XL LVL3 (GOWN DISPOSABLE) ×2 IMPLANT
HYDROSET GAMMA ×3 IMPLANT
IMMOBILIZER KNEE 22 UNIV (SOFTGOODS) IMPLANT
IMMOBILIZER KNEE 24 THIGH 36 (MISCELLANEOUS) IMPLANT
IMMOBILIZER KNEE 24 UNIV (MISCELLANEOUS)
K-WIRE SMOOTH 2.0X150 (WIRE) ×3
KWIRE SMOOTH 2.0X150 (WIRE) ×1 IMPLANT
LEFT 4 HOLE PROXIMAL MEDIAL TIBIAL PLATE (Plate) ×3 IMPLANT
NDL SUT 6 .5 CRC .975X.05 MAYO (NEEDLE) ×1 IMPLANT
NEEDLE HYPO 25X1 1.5 SAFETY (NEEDLE) IMPLANT
NEEDLE MAYO TAPER (NEEDLE) ×2
NS IRRIG 1000ML POUR BTL (IV SOLUTION) ×3 IMPLANT
PACK BASIN DAY SURGERY FS (CUSTOM PROCEDURE TRAY) ×3 IMPLANT
PAD CAST 4YDX4 CTTN HI CHSV (CAST SUPPLIES) ×1 IMPLANT
PADDING CAST COTTON 4X4 STRL (CAST SUPPLIES) ×2
PENCIL BUTTON HOLSTER BLD 10FT (ELECTRODE) ×3 IMPLANT
SCREW BONE 36X3.5MM (Screw) ×3 IMPLANT
SCREW LOCKING 4.0X70MM (Screw) ×6 IMPLANT
SLEEVE SCD COMPRESS KNEE MED (MISCELLANEOUS) IMPLANT
SPONGE LAP 4X18 X RAY DECT (DISPOSABLE) ×3 IMPLANT
STAPLER VISISTAT 35W (STAPLE) IMPLANT
STOCKINETTE 6  STRL (DRAPES)
STOCKINETTE 6 STRL (DRAPES) IMPLANT
STOCKINETTE IMPERVIOUS LG (DRAPES) IMPLANT
SUCTION FRAZIER TIP 10 FR DISP (SUCTIONS) ×3 IMPLANT
SUT ETHILON 3 0 PS 1 (SUTURE) ×6 IMPLANT
SUT FIBERWIRE #2 38 T-5 BLUE (SUTURE)
SUT FIBERWIRE 2-0 18 17.9 3/8 (SUTURE) ×6
SUT STEEL 7 (SUTURE) IMPLANT
SUT VIC AB 0 CT1 27 (SUTURE) ×2
SUT VIC AB 0 CT1 27XBRD ANBCTR (SUTURE) ×1 IMPLANT
SUT VIC AB 2-0 SH 27 (SUTURE)
SUT VIC AB 2-0 SH 27XBRD (SUTURE) IMPLANT
SUTURE FIBERWR #2 38 T-5 BLUE (SUTURE) IMPLANT
SUTURE FIBERWR 2-0 18 17.9 3/8 (SUTURE) ×2 IMPLANT
SYR BULB 3OZ (MISCELLANEOUS) ×3 IMPLANT
SYR CONTROL 10ML LL (SYRINGE) ×3 IMPLANT
TUBE CONNECTING 20'X1/4 (TUBING) ×1
TUBE CONNECTING 20X1/4 (TUBING) ×2 IMPLANT
UNDERPAD 30X30 (UNDERPADS AND DIAPERS) ×3 IMPLANT
YANKAUER SUCT BULB TIP NO VENT (SUCTIONS) ×3 IMPLANT

## 2014-10-25 NOTE — Transfer of Care (Signed)
Immediate Anesthesia Transfer of Care Note  Patient: Gordon Johnson  Procedure(s) Performed: Procedure(s): OPEN REDUCTION INTERNAL FIXATION (ORIF) RIGHT TIBIAL PLATEAU (Right)  Patient Location: PACU  Anesthesia Type:General  Level of Consciousness: awake, sedated and responds to stimulation  Airway & Oxygen Therapy: Patient Spontanous Breathing and Patient connected to face mask oxygen  Post-op Assessment: Report given to RN, Post -op Vital signs reviewed and stable and Patient moving all extremities  Post vital signs: Reviewed and stable  Last Vitals:  Filed Vitals:   10/25/14 0939  BP: 136/80  Pulse: 82  Temp: 36.3 C  Resp: 20    Complications: No apparent anesthesia complications

## 2014-10-25 NOTE — Anesthesia Preprocedure Evaluation (Addendum)
Anesthesia Evaluation  Patient identified by MRN, date of birth, ID band Patient awake    Reviewed: Allergy & Precautions, NPO status , Patient's Chart, lab work & pertinent test results  History of Anesthesia Complications Negative for: history of anesthetic complications  Airway Mallampati: II  TM Distance: >3 FB Neck ROM: Full    Dental  (+) Teeth Intact, Dental Advisory Given   Pulmonary former smoker,  breath sounds clear to auscultation  Pulmonary exam normal       Cardiovascular negative cardio ROS Normal cardiovascular examRhythm:Regular Rate:Normal     Neuro/Psych negative neurological ROS  negative psych ROS   GI/Hepatic negative GI ROS, Neg liver ROS,   Endo/Other  negative endocrine ROS  Renal/GU negative Renal ROS  negative genitourinary   Musculoskeletal negative musculoskeletal ROS (+)   Abdominal   Peds negative pediatric ROS (+)  Hematology negative hematology ROS (+)   Anesthesia Other Findings   Reproductive/Obstetrics negative OB ROS                          Anesthesia Physical Anesthesia Plan  ASA: I  Anesthesia Plan: General and Regional   Post-op Pain Management: MAC Combined w/ Regional for Post-op pain   Induction: Intravenous  Airway Management Planned: LMA  Additional Equipment:   Intra-op Plan:   Post-operative Plan: Extubation in OR  Informed Consent: I have reviewed the patients History and Physical, chart, labs and discussed the procedure including the risks, benefits and alternatives for the proposed anesthesia with the patient or authorized representative who has indicated his/her understanding and acceptance.   Dental advisory given  Plan Discussed with: CRNA, Anesthesiologist and Surgeon  Anesthesia Plan Comments: (Consented for post-op block if pain control inadequate.)     Anesthesia Quick Evaluation

## 2014-10-25 NOTE — Discharge Instructions (Signed)
Keep your knee bledsoe on full time  Keep dressings on and dry until follow up  No weight on Right leg.

## 2014-10-25 NOTE — Anesthesia Procedure Notes (Addendum)
Procedure Name: LMA Insertion Date/Time: 10/25/2014 10:42 AM Performed by: Baxter Flattery Pre-anesthesia Checklist: Patient identified, Emergency Drugs available, Suction available and Patient being monitored Patient Re-evaluated:Patient Re-evaluated prior to inductionOxygen Delivery Method: Circle System Utilized Preoxygenation: Pre-oxygenation with 100% oxygen Intubation Type: IV induction Ventilation: Mask ventilation without difficulty LMA: LMA inserted LMA Size: 5.0 Number of attempts: 1 Airway Equipment and Method: Bite block Placement Confirmation: positive ETCO2 and breath sounds checked- equal and bilateral Tube secured with: Tape Dental Injury: Teeth and Oropharynx as per pre-operative assessment    Anesthesia Regional Block:  Femoral nerve block  Pre-Anesthetic Checklist: ,, timeout performed,,,,,,,,,,,,  Laterality: Right  Prep: chloraprep       Needles:  Injection technique: Single-shot  Needle Type: Echogenic Stimulator Needle     Needle Length: 9cm 9 cm Needle Gauge: 21 and 21 G    Additional Needles:  Procedures: ultrasound guided (picture in chart) and nerve stimulator Femoral nerve block  Nerve Stimulator or Paresthesia:  Response: quadraceps contraction, 0.45 mA,   Additional Responses:   Narrative:  Start time: 10/25/2014 1:05 PM End time: 10/25/2014 1:12 PM Injection made incrementally with aspirations every 5 mL.  Performed by: Personally  Anesthesiologist: Suzette Battiest  Additional Notes: Functioning IV was confirmed and monitors were applied.Ultrasound guidance: relevant anatomy identified, needle position confirmed, local anesthetic spread visualized around nerve(s)., vascular puncture avoided.  Image printed for medical record. Negative aspiration and negative test dose prior to incremental administration of local anesthetic. The patient tolerated the procedure well.

## 2014-10-25 NOTE — Interval H&P Note (Signed)
History and Physical Interval Note:  10/25/2014 8:24 AM  Gordon Johnson  has presented today for surgery, with the diagnosis of UNSPECIFIED FRACTURE OF UPPER END OF UNSPECIFIED TIBIA INTIAL ENCOUNTER FOR CLOSED FRACTURE   The various methods of treatment have been discussed with the patient and family. After consideration of risks, benefits and other options for treatment, the patient has consented to  Procedure(s): OPEN REDUCTION INTERNAL FIXATION (ORIF) RIGHT TIBIAL PLATEAU (Right) as a surgical intervention .  The patient's history has been reviewed, patient examined, no change in status, stable for surgery.  I have reviewed the patient's chart and labs.  Questions were answered to the patient's satisfaction.     Wake Conlee D

## 2014-10-25 NOTE — Progress Notes (Signed)
Assisted Dr. Rob Fitzgerald with right, ultrasound guided, femoral block. Side rails up, monitors on throughout procedure. See vital signs in flow sheet. Tolerated Procedure well. 

## 2014-10-25 NOTE — Op Note (Signed)
10/25/2014  12:18 PM  PATIENT:  Gordon Johnson    PRE-OPERATIVE DIAGNOSIS:  UNSPECIFIED FRACTURE OF UPPER END OF UNSPECIFIED TIBIA INTIAL ENCOUNTER FOR CLOSED FRACTURE   POST-OPERATIVE DIAGNOSIS:  Same  PROCEDURE:  OPEN REDUCTION INTERNAL FIXATION (ORIF) RIGHT TIBIAL PLATEAU  SURGEON:  Collen Vincent D, MD  ASSISTANT: none  ANESTHESIA:   gen  PREOPERATIVE INDICATIONS:  [name] is a  49 y.o. male with a diagnosis of UNSPECIFIED FRACTURE OF UPPER END OF UNSPECIFIED TIBIA INTIAL ENCOUNTER FOR CLOSED FRACTURE  who failed conservative measures and elected for surgical management.    The risks benefits and alternatives were discussed with the patient preoperatively including but not limited to the risks of infection, bleeding, nerve injury, cardiopulmonary complications, the need for revision surgery, among others, and the patient was willing to proceed.  OPERATIVE IMPLANTS: stryker medial locking plate  OPERATIVE FINDINGS: unstable fracture  BLOOD LOSS: min  COMPLICATIONS: none  TOURNIQUET TIME: 13min  OPERATIVE PROCEDURE:  Patient was identified in the preoperative holding area and site was marked by me He was transported to the operating theater and placed on the table in supine position taking care to pad all bony prominences. After a preincinduction time out anesthesia was induced. The right lower extremity was prepped and draped in normal sterile fashion and a pre-incision timeout was performed. He received ancef for preoperative antibiotics.   I made a medial approach to his knee. I identified and protected the has anserine tendons. Identified his fracture as well. I performed a sub-meniscal arthrotomy and I protect the MCL by staying anterior.  Identified his fracture site opened up the split was artery present on the medial side.  Identified 2 large articular fragments and placed them back in their location and pinned them in place with a K wire. This did take a few it  sounds as very happy with its placement.  I then selected a medial plate needed to settle little more anterior so actually used the left medial plate. Due to the conformity of his tibia it was slightly prominent proximally but I elected to ups except this as it fit much better than the right medial plate.  I pinned it distally and pinned it proximally and was happy with the placement of the plate and placed a shaft screw I did leave the shaft portion a little bit prominent as it was over top of the pelvis and I wanted it to sit down better top.  I then placed 3 locking screws proximally and was happy with the placement all of these prior to that I placed a nonlocking screw proximally to suck the plate down up top.  I placed a second shaft screw and took multiple x-rays and was happy with the reduction and the placement of hardware. I then injected 5 mL of Hydrocet through the split in the front. I then close this down.  I closed the sub-meniscal arthrotomy after repairing an anterior meniscal tear with a 2-0 FiberWire.  I then closed the skin using a nylon stitch. The proximal aspect of the plate was somewhat prominent as would be expected at the medial side of the tibia this was however unavoidable.  Prior this I did irrigate the joint with a liter saline and irrigated his wound.  I then closed the skin with a nylon stitch sterile dressing was applied he was placed in a Bledsoe locked in extension and taken the PACU in stable condition.  POST OPERATIVE PLAN: NWB, ASA 325 for  dvt px.     This note was generated using a template and dragon dictation system. In light of that, I have reviewed the note and all aspects of it are applicable to this case. Any dictation errors are due to the computerized dictation system.

## 2014-10-25 NOTE — Anesthesia Postprocedure Evaluation (Signed)
  Anesthesia Post-op Note  Patient: Gordon Johnson  Procedure(s) Performed: Procedure(s): OPEN REDUCTION INTERNAL FIXATION (ORIF) RIGHT TIBIAL PLATEAU (Right)  Patient Location: PACU  Anesthesia Type:GA combined with regional for post-op pain  Level of Consciousness: awake and alert   Airway and Oxygen Therapy: Patient Spontanous Breathing  Post-op Pain: mild  Post-op Assessment: Post-op Vital signs reviewed     RLE Motor Response: Purposeful movement RLE Sensation: Full sensation, Pain      Post-op Vital Signs: Reviewed  Last Vitals:  Filed Vitals:   10/25/14 1353  BP: 148/85  Pulse: 88  Temp: 36.4 C  Resp: 16    Complications: No apparent anesthesia complications

## 2014-10-26 ENCOUNTER — Encounter (HOSPITAL_BASED_OUTPATIENT_CLINIC_OR_DEPARTMENT_OTHER): Payer: Self-pay | Admitting: Orthopedic Surgery

## 2014-10-26 DIAGNOSIS — S82141A Displaced bicondylar fracture of right tibia, initial encounter for closed fracture: Secondary | ICD-10-CM | POA: Diagnosis not present

## 2014-10-29 ENCOUNTER — Encounter (HOSPITAL_BASED_OUTPATIENT_CLINIC_OR_DEPARTMENT_OTHER): Payer: Self-pay | Admitting: Orthopedic Surgery

## 2017-05-06 ENCOUNTER — Other Ambulatory Visit (HOSPITAL_COMMUNITY): Payer: Self-pay | Admitting: Internal Medicine

## 2017-05-06 DIAGNOSIS — M545 Low back pain: Secondary | ICD-10-CM

## 2017-05-12 ENCOUNTER — Ambulatory Visit (HOSPITAL_COMMUNITY)
Admission: RE | Admit: 2017-05-12 | Discharge: 2017-05-12 | Disposition: A | Discharge status: Home / Self Care | Payer: Commercial Managed Care - PPO | Source: Ambulatory Visit | Attending: Internal Medicine | Admitting: Internal Medicine

## 2017-05-12 DIAGNOSIS — M5126 Other intervertebral disc displacement, lumbar region: Secondary | ICD-10-CM | POA: Insufficient documentation

## 2017-05-12 DIAGNOSIS — M1288 Other specific arthropathies, not elsewhere classified, other specified site: Secondary | ICD-10-CM | POA: Insufficient documentation

## 2017-05-12 DIAGNOSIS — R609 Edema, unspecified: Secondary | ICD-10-CM | POA: Diagnosis not present

## 2017-05-12 DIAGNOSIS — M545 Low back pain: Secondary | ICD-10-CM | POA: Diagnosis present

## 2018-09-22 ENCOUNTER — Other Ambulatory Visit (HOSPITAL_COMMUNITY): Payer: Self-pay | Admitting: Nurse Practitioner

## 2018-09-22 ENCOUNTER — Other Ambulatory Visit: Payer: Self-pay

## 2018-09-22 ENCOUNTER — Ambulatory Visit (HOSPITAL_COMMUNITY)
Admission: RE | Admit: 2018-09-22 | Discharge: 2018-09-22 | Disposition: A | Discharge status: Home / Self Care | Payer: Commercial Managed Care - PPO | Source: Ambulatory Visit | Attending: Nurse Practitioner | Admitting: Nurse Practitioner

## 2018-09-22 DIAGNOSIS — M5440 Lumbago with sciatica, unspecified side: Secondary | ICD-10-CM | POA: Insufficient documentation

## 2018-12-08 ENCOUNTER — Encounter (INDEPENDENT_AMBULATORY_CARE_PROVIDER_SITE_OTHER): Payer: Self-pay | Admitting: Internal Medicine

## 2018-12-08 ENCOUNTER — Other Ambulatory Visit: Payer: Self-pay

## 2018-12-08 ENCOUNTER — Ambulatory Visit (INDEPENDENT_AMBULATORY_CARE_PROVIDER_SITE_OTHER): Payer: Commercial Managed Care - PPO | Admitting: Internal Medicine

## 2018-12-08 ENCOUNTER — Encounter (INDEPENDENT_AMBULATORY_CARE_PROVIDER_SITE_OTHER): Payer: Self-pay | Admitting: *Deleted

## 2018-12-08 VITALS — BP 130/80 | HR 72 | Ht 72.0 in | Wt 203.8 lb

## 2018-12-08 DIAGNOSIS — M545 Low back pain: Secondary | ICD-10-CM

## 2018-12-08 DIAGNOSIS — Z0001 Encounter for general adult medical examination with abnormal findings: Secondary | ICD-10-CM

## 2018-12-08 DIAGNOSIS — Z131 Encounter for screening for diabetes mellitus: Secondary | ICD-10-CM | POA: Diagnosis not present

## 2018-12-08 DIAGNOSIS — G8929 Other chronic pain: Secondary | ICD-10-CM

## 2018-12-08 DIAGNOSIS — Z125 Encounter for screening for malignant neoplasm of prostate: Secondary | ICD-10-CM | POA: Diagnosis not present

## 2018-12-08 DIAGNOSIS — R5381 Other malaise: Secondary | ICD-10-CM

## 2018-12-08 DIAGNOSIS — R5383 Other fatigue: Secondary | ICD-10-CM

## 2018-12-08 DIAGNOSIS — M791 Myalgia, unspecified site: Secondary | ICD-10-CM

## 2018-12-08 DIAGNOSIS — Z13220 Encounter for screening for lipoid disorders: Secondary | ICD-10-CM

## 2018-12-08 DIAGNOSIS — Z1211 Encounter for screening for malignant neoplasm of colon: Secondary | ICD-10-CM

## 2018-12-08 DIAGNOSIS — E559 Vitamin D deficiency, unspecified: Secondary | ICD-10-CM

## 2018-12-08 NOTE — Progress Notes (Signed)
Chief Complaint: This 53 year old man comes in for his annual physical exam today. HPI: He is feeling well but he does appear to have some low back discomfort which usually typically occurs in the morning and last for about an hour.  He describes it as a bee sting.  He had the symptoms for the last 4 to 6 months and he did see Judson Roch in July with the symptoms.  X-rays were done which showed degenerative joint disease.  He also has had an MRI of his lumbar spine in 2019 which showed degenerative joint disease also.  He denies any paresthesia in his legs.  Once the episode is over of discomfort, after an hour, he has no pain or discomfort throughout the day.  He is able to lift heavy weights, which is part of his job without any problems.  His gait is not affected. He describes intermittent fatigue.  Past Medical History:  Diagnosis Date  . Tibial plateau fracture, right    Past Surgical History:  Procedure Laterality Date  . APPENDECTOMY    . 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        Allergies: No Known Allergies   No outpatient medications have been marked as taking for the 12/08/18 encounter (Office Visit) with Doree Albee, MD.      [id number] from the symptoms mentioned above,there are no other symptoms referable to all systems reviewed.  Physical Exam: Blood pressure 130/80, pulse 72, height 6' (1.829 m), weight 203 lb 12.8 oz (92.4 kg). Vitals with BMI 12/08/2018 10/26/2014 10/26/2014  Height 6\' 0"  - -  Weight 203 lbs 13 oz - -  BMI 0000000 - -  Systolic [id number] - [id number]  Diastolic 80 - 69  Pulse 72 78 60      He looks systemically well. General: Alert, cooperative, and appears to be  the stated age.No pallor.  No jaundice.  No clubbing. Head: Normocephalic Eyes: Sclera white, pupils equal and reactive to light, red reflex x 2,  Ears: Normal bilaterally Oral cavity: Lips, mucosa, and tongue normal: Teeth and gums normal Neck: No adenopathy, supple, symmetrical, trachea midline, and thyroid does not appear enlarged Respiratory: Clear to auscultation bilaterally.No wheezing, crackles or bronchial breathing. Cardiovascular: Heart sounds are present and appear to be normal without murmurs or added sounds.  No carotid bruits.  Peripheral pulses are present and equal bilaterally.: Soft, nontender, Gastrointestinal:positive bowel sounds, no hepatosplenomegaly.  No masses felt.No tenderness. Skin: Clear, No rashes noted.No worrisome skin lesions seen. Neurological: Grossly intact without focal findings, cranial nerves II through XII intact, muscle strength equal bilaterally Musculoskeletal: No acute joint abnormalities noted.Full range of movement noted with joints.  In particular, there was no tenderness in his spine, especially in the lumbar area.  Back movements appear to be normal.  Hip movements are normal with no pain in the hips.  Straight leg raising does not cause any pain in the back. Psychiatric: Affect appropriate, non-anxious.    Assessment  1. Colon cancer screening   2. Malaise and fatigue   3. Special screening for malignant neoplasm of prostate   4. Screening for diabetes mellitus   5. Screening for lipoid disorders   6. Myalgia   7.  Vitamin D deficiency disease   8. Chronic bilateral low back pain without sciatica   9. Encounter for general adult medical examination with abnormal findings     Tests Ordered:   Orders Placed This Encounter  Procedures  . CBC  . COMPLETE METABOLIC PANEL WITH GFR  . C-reactive protein  . Hemoglobin A1c  . Lipid panel  . PSA  . T3, free  . T4  . TSH  . VITAMIN D 25 Hydroxy (Vit-D Deficiency, Fractures)  .  Sedimentation Rate  . Ambulatory referral to Gastroenterology     Plan  1. He appears to be healthy but he is concerned about this back discomfort/pain.  He wants to make sure there is nothing serious going on.  Blood work has been ordered above including sed rate and C-reactive protein.  I will let him know the results. 2. He is willing to have colonoscopy and we will refer him for this. 3. Further recommendations will depend on above results and otherwise I will see him in 1 year for an annual physical exam. 4. Today, in addition to a preventative visit, I performed an office visit to address his symptoms of low back pain.        Fidela Cieslak C Sabrin Dunlevy   12/08/2018, 2:01 PM

## 2018-12-09 ENCOUNTER — Encounter (INDEPENDENT_AMBULATORY_CARE_PROVIDER_SITE_OTHER): Payer: Self-pay | Admitting: Internal Medicine

## 2018-12-09 LAB — CBC
HCT: 47.2 % (ref 38.5–50.0)
Hemoglobin: 16.3 g/dL (ref 13.2–17.1)
MCH: 30.4 pg (ref 27.0–33.0)
MCHC: 34.5 g/dL (ref 32.0–36.0)
MCV: 88.1 fL (ref 80.0–100.0)
MPV: 9.4 fL (ref 7.5–12.5)
Platelets: 238 10*3/uL (ref 140–400)
RBC: 5.36 10*6/uL (ref 4.20–5.80)
RDW: 12.2 % (ref 11.0–15.0)
WBC: 8.4 10*3/uL (ref 3.8–10.8)

## 2018-12-09 LAB — TSH: TSH: 0.92 mIU/L (ref 0.40–4.50)

## 2018-12-09 LAB — COMPLETE METABOLIC PANEL WITH GFR
AG Ratio: 2.1 (calc) (ref 1.0–2.5)
ALT: 28 U/L (ref 9–46)
AST: 21 U/L (ref 10–35)
Albumin: 4.6 g/dL (ref 3.6–5.1)
Alkaline phosphatase (APISO): 66 U/L (ref 35–144)
BUN: 19 mg/dL (ref 7–25)
CO2: 28 mmol/L (ref 20–32)
Calcium: 9.5 mg/dL (ref 8.6–10.3)
Chloride: 107 mmol/L (ref 98–110)
Creat: 1.19 mg/dL (ref 0.70–1.33)
GFR, Est African American: 81 mL/min/{1.73_m2} (ref >=60)
GFR, Est Non African American: 70 mL/min/{1.73_m2} (ref >=60)
Globulin: 2.2 g/dL (calc) (ref 1.9–3.7)
Glucose, Bld: 78 mg/dL (ref 65–99)
Potassium: 4.7 mmol/L (ref 3.5–5.3)
Sodium: 141 mmol/L (ref 135–146)
Total Bilirubin: 1.4 mg/dL — ABNORMAL HIGH (ref 0.2–1.2)
Total Protein: 6.8 g/dL (ref 6.1–8.1)

## 2018-12-09 LAB — SEDIMENTATION RATE: Sed Rate: 2 mm/h (ref 0–20)

## 2018-12-09 LAB — LIPID PANEL
Cholesterol: 185 mg/dL (ref <200)
HDL: 43 mg/dL (ref >=40)
LDL Cholesterol (Calc): 114 mg/dL (calc) — ABNORMAL HIGH
Non-HDL Cholesterol (Calc): 142 mg/dL (calc) — ABNORMAL HIGH (ref <130)
Total CHOL/HDL Ratio: 4.3 (calc) (ref <5.0)
Triglycerides: 166 mg/dL — ABNORMAL HIGH (ref <150)

## 2018-12-09 LAB — T3, FREE: T3, Free: 3.9 pg/mL (ref 2.3–4.2)

## 2018-12-09 LAB — VITAMIN D 25 HYDROXY (VIT D DEFICIENCY, FRACTURES): Vit D, 25-Hydroxy: 31 ng/mL (ref 30–100)

## 2018-12-09 LAB — PSA: PSA: 0.4 ng/mL (ref <=4.0)

## 2018-12-09 LAB — T4: T4, Total: 11.5 ug/dL — ABNORMAL HIGH (ref 4.9–10.5)

## 2018-12-09 LAB — HEMOGLOBIN A1C
Hgb A1c MFr Bld: 5.1 % of total Hgb (ref <5.7)
Mean Plasma Glucose: 100 (calc)
eAG (mmol/L): 5.5 (calc)

## 2018-12-09 LAB — C-REACTIVE PROTEIN: CRP: 4.1 mg/L (ref <8.0)

## 2018-12-14 ENCOUNTER — Telehealth (INDEPENDENT_AMBULATORY_CARE_PROVIDER_SITE_OTHER): Payer: Self-pay | Admitting: Internal Medicine

## 2018-12-14 NOTE — Telephone Encounter (Signed)
Please call this patient and let him know that all the blood work was reasonably okay and no evidence of serious problems.  I have mailed him all his lab work with my comments also.  Encouraged him to go on to my chart.

## 2018-12-14 NOTE — Telephone Encounter (Signed)
Pt would like to look in a more further into his neck problem. He appreciate to know there is nothing going on with the lab work. Would like to see you about neck to see what is next options.

## 2018-12-14 NOTE — Telephone Encounter (Signed)
Sure, if you would like to set up an appointment, please call him back and we can do this.  Alternatively, if you would like testing/imaging of his neck area, I am happy to do this since I already did see him and he did mention it at the time.  Let me know what he would like to do.

## 2018-12-15 NOTE — Telephone Encounter (Signed)
Set him on schedule for Monday after lunch slot.

## 2018-12-19 ENCOUNTER — Other Ambulatory Visit: Payer: Self-pay

## 2018-12-19 ENCOUNTER — Ambulatory Visit (INDEPENDENT_AMBULATORY_CARE_PROVIDER_SITE_OTHER): Payer: Commercial Managed Care - PPO | Admitting: Internal Medicine

## 2018-12-19 ENCOUNTER — Other Ambulatory Visit (INDEPENDENT_AMBULATORY_CARE_PROVIDER_SITE_OTHER): Payer: Self-pay

## 2018-12-19 ENCOUNTER — Encounter (INDEPENDENT_AMBULATORY_CARE_PROVIDER_SITE_OTHER): Payer: Self-pay | Admitting: Internal Medicine

## 2018-12-19 VITALS — BP 120/70 | HR 72 | Ht 72.0 in | Wt 208.8 lb

## 2018-12-19 DIAGNOSIS — R0789 Other chest pain: Secondary | ICD-10-CM | POA: Diagnosis not present

## 2018-12-19 NOTE — Progress Notes (Signed)
   Wellness Office Visit  Subjective:  Patient ID: Gordon Johnson, male    DOB: November 02, 1965  Age: 53 y.o. MRN: [id number]  CC: This man comes in because he is concerned about right upper chest discomfort/pain. HPI  He has had this discomfort/pain for the last several weeks.  He is a former smoker and used to be a smoker for a long period of many years and he has quit recently and he is now concerned about the possibility of lung cancer.  He denies any cough, hemoptysis, dyspnea, weight loss. Past Medical History:  Diagnosis Date  . Tibial plateau fracture, right       Family History  Problem Relation Age of Onset  . Breast cancer Mother   . 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     No outpatient medications have been marked as taking for the 12/19/18 encounter (Office Visit) with Doree Albee, MD.       Objective:   Today's Vitals: BP 120/70   Pulse 72   Ht 6' (1.829 m)   Wt 208 lb 12.8 oz (94.7 kg)   BMI 28.32 kg/m  Vitals with BMI 12/19/2018 12/08/2018 10/26/2014  Height 6\' 0"  6\' 0"  -  Weight 208 lbs 13 oz 203 lbs 13 oz -  BMI [id number] 0000000 -  Systolic 123456 [id number] -  Diastolic 70 80 -  Pulse 72 72 78     Physical Exam   He looks systemically well.  He does have some right anterior chest wall tenderness towards the right shoulder.  Lung fields are clear.    Assessment   1. Other chest pain       Tests ordered Orders Placed This Encounter  Procedures  . CT CHEST LUNG CA SCREEN LOW DOSE W/O CM     Plan: 1. Although I do not think he has lung cancer by his history, the discomfort/pain for several weeks and recently quit smoker is somewhat concerning and we will order a CT chest lung cancer screening scan to see if this will shed any light.   No orders of the defined types were placed in this encounter.   Doree Albee, MD

## 2018-12-22 ENCOUNTER — Telehealth (INDEPENDENT_AMBULATORY_CARE_PROVIDER_SITE_OTHER): Payer: Self-pay

## 2018-12-22 NOTE — Telephone Encounter (Signed)
*  Update Auth for CT order*  [email or web address] Thu 12/22/2018 7:20 AM Dear Provider, Thank you for your prior authorization submitted under Case ID# [id number] for Gordon Johnson. This email is to confirm that your prior authorization request has been processed and status has been updated as Approved.  Comments/Confirmation No: Approved [id number]  You can check and print the case status online Here (Verify patient information and use provider/facility Tax Identification Number to view transactions).  Please use comments above when contacting the Online Prior Authorization Intake Team concerning this prior authorization.  Sincerely, Online Prior Authorization Intake Team

## 2019-01-03 ENCOUNTER — Other Ambulatory Visit: Payer: Self-pay

## 2019-01-03 ENCOUNTER — Ambulatory Visit (HOSPITAL_COMMUNITY)
Admission: RE | Admit: 2019-01-03 | Discharge: 2019-01-03 | Disposition: A | Discharge status: Home / Self Care | Payer: Commercial Managed Care - PPO | Source: Ambulatory Visit | Attending: Internal Medicine | Admitting: Internal Medicine

## 2019-01-03 DIAGNOSIS — R0789 Other chest pain: Secondary | ICD-10-CM | POA: Insufficient documentation

## 2019-01-04 ENCOUNTER — Other Ambulatory Visit (INDEPENDENT_AMBULATORY_CARE_PROVIDER_SITE_OTHER): Payer: Self-pay | Admitting: Internal Medicine

## 2019-01-04 DIAGNOSIS — K7460 Unspecified cirrhosis of liver: Secondary | ICD-10-CM

## 2019-01-05 ENCOUNTER — Encounter: Payer: Self-pay | Admitting: Gastroenterology

## 2019-02-02 ENCOUNTER — Ambulatory Visit (INDEPENDENT_AMBULATORY_CARE_PROVIDER_SITE_OTHER): Payer: Commercial Managed Care - PPO | Admitting: Nurse Practitioner

## 2019-02-02 ENCOUNTER — Encounter: Payer: Self-pay | Admitting: *Deleted

## 2019-02-02 ENCOUNTER — Other Ambulatory Visit: Payer: Self-pay | Admitting: *Deleted

## 2019-02-02 ENCOUNTER — Encounter: Payer: Self-pay | Admitting: Nurse Practitioner

## 2019-02-02 ENCOUNTER — Other Ambulatory Visit: Payer: Self-pay

## 2019-02-02 DIAGNOSIS — Z0000 Encounter for general adult medical examination without abnormal findings: Secondary | ICD-10-CM | POA: Diagnosis not present

## 2019-02-02 DIAGNOSIS — R932 Abnormal findings on diagnostic imaging of liver and biliary tract: Secondary | ICD-10-CM

## 2019-02-02 DIAGNOSIS — Z1211 Encounter for screening for malignant neoplasm of colon: Secondary | ICD-10-CM | POA: Insufficient documentation

## 2019-02-02 MED ORDER — PEG 3350-KCL-NA BICARB-NACL 420 G PO SOLR: ORAL | 0 refills | Status: DC

## 2019-02-02 NOTE — Assessment & Plan Note (Signed)
The patient is 53 years old and has never had a colonoscopy.  He is interested in pursuing scheduling today.  I will add on possibility of upper endoscopy due to question of cirrhosis and if so he would need variceal screening as there is also possible signs of portal hypertension.  No overt GI symptoms.  Follow-up in 6 to 8 weeks.  Proceed with colonoscopy +/- EGD with Dr. Oneida Alar in the near future. The risks, benefits, and alternatives have been discussed in detail with the patient. They state understanding and desire to proceed.   The patient is not on any medications.  Denies alcohol and drug use.  Conscious sedation should be adequate for his procedure.

## 2019-02-02 NOTE — Patient Instructions (Signed)
Your health issues we discussed today were:   Abnormal appearing liver on your chest CT: 1. To evaluate if you have liver scarring and/or cirrhosis have your labs done as soon as you can 2. We will help schedule your ultrasound of your abdomen and special ultrasound for scarring in your liver 3. We may add on an upper endoscopy to your colonoscopy if it appears you have significant liver scarring 4. Call us if you have any concerning symptoms such as confusion, yellowing of your skin or eyes, brown urine, etc.  Need for colonoscopy: 1. We will schedule your colonoscopy for you 2. Further recommendations will follow your colonoscopy  Overall I recommend:  1. Return for follow-up in 6 to 8 weeks 2. Call us if you have any questions or concerns   Because of recent events of COVID-19 ("Coronavirus"), follow CDC recommendations:  1. Wash your hand frequently 2. Avoid touching your face 3. Stay away from people who are sick 4. If you have symptoms such as fever, cough, shortness of breath then call your healthcare provider for further guidance 5. If you are sick, STAY AT HOME unless otherwise directed by your healthcare provider. 6. Follow directions from state and national officials regarding staying safe   At [facility] we value your feedback. You may receive a survey about your visit today. Please share your experience as we strive to create trusting relationships with our patients to provide genuine, compassionate, quality care.  We appreciate your understanding and patience as we review any laboratory studies, imaging, and other diagnostic tests that are ordered as we care for you. Our office policy is 5 business days for review of these results, and any emergent or urgent results are addressed in a timely manner for your best interest. If you do not hear from our office in 1 week, please contact us.   We also encourage the use of MyChart, which contains your medical  information for your review as well. If you are not enrolled in this feature, an access code is on this after visit summary for your convenience. Thank you for allowing [place] to be involved in your care.  It was great to see you today!  I hope you have a Merry Christmas and Happy Holidays!!

## 2019-02-02 NOTE — Addendum Note (Signed)
Addended by: Cheron Every on: 02/02/2019 09:38 AM   Modules accepted: Orders

## 2019-02-02 NOTE — Progress Notes (Addendum)
REVIEWED-NO ADDITIONAL RECOMMENDATIONS.  Primary Care Physician:  Doree Albee, MD Primary Gastroenterologist:  Dr. Oneida Alar  Chief Complaint  Patient presents with  . Cirrhosis    HPI:   Gordon Johnson is a 53 y.o. male who presents on referral from primary care for cirrhosis.  Reviewed information provided with referral including office visit dated 12/19/2018 which was for chest pain.  Noted for several weeks, former smoker.  On exam overall looks well and noted anterior chest wall tenderness toward the right shoulder.  Recommended low-dose CT of the chest for lung cancer screening.  CT of the chest was completed 01/03/2019 which found scattered bilateral pulmonary nodules with no follow-up needed if low risk versus noncontrast CT in 12 months if at risk.  Upper abdominal findings concerning for cirrhosis and portal hypertension due to nodular liver surface, apparently borderline enlarged spleen.  Last CMP in our system dated 12/08/2018 was essentially normal other than bilirubin at 1.4.  Other LFTs normal.  CBC the same day found normal platelet count 238.  Today he states he's doing ok overall. Denies abdominal pain, N/V, hematochezia, melena, fever, chills, unintentional weight loss. Denies yellowing of skin/eyes, darkened urine, acute episodic confusion, tremors, generalized pruritis. Denies URI or flu-like symptoms. Denies loss of sense of taste or smell. Denies chest pain, dyspnea, dizziness, lightheadedness, syncope, near syncope. Denies any other upper or lower GI symptoms.  Has never had a colonoscopy before.  Not on any medications currently.  Past Medical History:  Diagnosis Date  . Tibial plateau fracture, right     Past Surgical History:  Procedure Laterality Date  . APPENDECTOMY    . ORIF TIBIA PLATEAU Right 10/25/2014   Procedure: OPEN REDUCTION INTERNAL FIXATION (ORIF) RIGHT TIBIAL PLATEAU;  Surgeon: Renette Butters, MD;  Location: [facility];   Service: Orthopedics;  Laterality: Right;    No current outpatient medications on file.   No current facility-administered medications for this visit.     Allergies as of 02/02/2019  . (No Known Allergies)    Family History  Problem Relation Age of Onset  . Breast cancer Mother   . Prostate cancer Brother   . Colon cancer Neg Hx   . Gastric cancer Neg Hx   . Esophageal cancer Neg Hx   . Liver disease Neg Hx     Social History   Socioeconomic History  . Marital status: Married    Spouse name: Not on file  . Number of children: Not on file  . Years of education: Not on file  . Highest education level: Not on file  Occupational History  . Not on file  Social Needs  . Financial resource strain: Not on file  . Food insecurity    Worry: Not on file    Inability: Not on file  . Transportation needs    Medical: Not on file    Non-medical: Not on file  Tobacco Use  . Smoking status: Current Some Day Smoker  . Smokeless tobacco: Never Used  . Tobacco comment: intermittently; not daily smoking  Substance and Sexual Activity  . Alcohol use: Not Currently    Comment: social (maybe 12 beers over 1-2 months); denied 02/02/19  . Drug use: No  . Sexual activity: Not on file  Lifestyle  . Physical activity    Days per week: Not on file    Minutes per session: Not on file  . Stress: Not on file  Relationships  . Social connections  Talks on phone: Not on file    Gets together: Not on file    Attends religious service: Not on file    Active member of club or organization: Not on file    Attends meetings of clubs or organizations: Not on file    Relationship status: Not on file  . Intimate partner violence    Fear of current or ex partner: Not on file    Emotionally abused: Not on file    Physically abused: Not on file    Forced sexual activity: Not on file  Other Topics Concern  . 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    Review of Systems: General: Negative for anorexia, weight loss, fever, chills, fatigue, weakness. ENT: Negative for hoarseness, difficulty swallowing , nasal congestion. CV: Negative for chest pain, angina, palpitations, dyspnea on exertion, peripheral edema.  Respiratory: Negative for dyspnea at rest, dyspnea on exertion, cough, sputum, wheezing.  GI: See history of present illness.  Derm: Negative for rash or itching.  Neuro: Negative for memory loss, confusion.   Endo: Negative for unusual weight change.  Heme: Negative for bruising or bleeding. Allergy: Negative for rash or hives.    Physical Exam: BP 124/81   Pulse 67   Temp (!) 95.7 F (35.4 C) (Temporal)   Ht 6' (1.829 m)   Wt 211 lb (95.7 kg)   BMI 28.62 kg/m  General:   Alert and oriented. Pleasant and cooperative. Well-nourished and well-developed.  Head:  Normocephalic and atraumatic. Eyes:  Without icterus, sclera clear and conjunctiva pink.  Ears:  Normal auditory acuity. Cardiovascular:  S1, S2 present without murmurs appreciated. Extremities without clubbing or edema. Respiratory:  Clear to auscultation bilaterally. No wheezes, rales, or rhonchi. No distress.  Gastrointestinal:  +BS, soft, non-tender and non-distended. No HSM noted. No guarding or rebound. No masses appreciated.  Rectal:  Deferred  Musculoskalatal:  Symmetrical without gross deformities. Neurologic:  Alert and oriented x4;  grossly normal neurologically. Psych:  Alert and cooperative. Normal mood and affect. Heme/Lymph/Immune: No excessive bruising noted.    02/02/2019 9:02 AM   Disclaimer: This note was dictated with voice recognition software. Similar sounding words can inadvertently be transcribed and may not be corrected upon review.

## 2019-02-02 NOTE — Assessment & Plan Note (Signed)
The patient was referred by primary care after CT of the chest for chest wall pain found nodular appearing liver in the upper abdomen and possibly enlarged spleen worrisome for cirrhosis and possible portal hypertension.  Labs earlier this year show essentially normal liver function other than mildly elevated bilirubin at 1.4.  At this point we will proceed with further work-up to determine if cirrhosis is actually going on.  Will check CBC, CMP, INR, hepatitis B serologies, hepatitis C serologies.  Also check an abdominal ultrasound with liver elastography for liver stiffness and any other overt signs of portal hypertension.  He has never had a colonoscopy before and we will proceed with this at this time and leave open the possibility of upper endoscopy if he does indeed appear to have cirrhosis for variceal screening.  Follow-up in 2 months.

## 2019-02-03 ENCOUNTER — Telehealth: Payer: Self-pay | Admitting: *Deleted

## 2019-02-03 NOTE — Telephone Encounter (Signed)
PA approved for TCS/EGD via [facility] website. Approved [id number] Dates: 05/01/2019 - 05/30/2019

## 2019-02-09 ENCOUNTER — Other Ambulatory Visit: Payer: Self-pay

## 2019-02-09 ENCOUNTER — Ambulatory Visit (HOSPITAL_COMMUNITY)
Admission: RE | Admit: 2019-02-09 | Discharge: 2019-02-09 | Disposition: A | Discharge status: Home / Self Care | Payer: Commercial Managed Care - PPO | Source: Ambulatory Visit | Attending: Nurse Practitioner | Admitting: Nurse Practitioner

## 2019-02-09 DIAGNOSIS — R932 Abnormal findings on diagnostic imaging of liver and biliary tract: Secondary | ICD-10-CM | POA: Insufficient documentation

## 2019-02-16 ENCOUNTER — Telehealth: Payer: Self-pay | Admitting: Gastroenterology

## 2019-02-16 NOTE — Telephone Encounter (Signed)
Pt is inquiring about u/s results.

## 2019-02-16 NOTE — Telephone Encounter (Signed)
[phone]  Patient called requesting ultrasound results,  Michela Pitcher it is on my chart but he does not understand it

## 2019-02-16 NOTE — Telephone Encounter (Signed)
See result note.  

## 2019-04-05 ENCOUNTER — Encounter: Payer: Self-pay | Admitting: Nurse Practitioner

## 2019-04-05 ENCOUNTER — Ambulatory Visit (INDEPENDENT_AMBULATORY_CARE_PROVIDER_SITE_OTHER): Payer: Commercial Managed Care - PPO | Admitting: Nurse Practitioner

## 2019-04-05 ENCOUNTER — Other Ambulatory Visit: Payer: Self-pay

## 2019-04-05 VITALS — BP 123/82 | HR 68 | Temp 97.1°F | Ht 72.0 in | Wt 211.6 lb

## 2019-04-05 DIAGNOSIS — R932 Abnormal findings on diagnostic imaging of liver and biliary tract: Secondary | ICD-10-CM | POA: Diagnosis not present

## 2019-04-05 DIAGNOSIS — Z0000 Encounter for general adult medical examination without abnormal findings: Secondary | ICD-10-CM | POA: Diagnosis not present

## 2019-04-05 NOTE — Progress Notes (Addendum)
REVIEWED-NO ADDITIONAL RECOMMENDATIONS.  Referring Provider: Doree Albee, MD Primary Care Physician:  Doree Albee, MD Primary GI:  Dr. Oneida Alar  Chief Complaint  Patient presents with  . Colonoscopy    left sided pain    HPI:   Gordon Johnson is a 54 y.o. male who presents for follow-up on abnormal CT and need for colonoscopy.  Previous chest wall tenderness with primary care and a CT of the chest was completed in November 2020 which had upper abdominal findings concerning for cirrhosis with portal hypertension.  Last CMP 12/08/2018 essentially normal other than bilirubin of 1.4.  Platelet count previously 238.  At his last visit he denied any hepatic or GI symptoms.  Had never had a colonoscopy before.  Negative family history of liver disease and colon cancer.  Social alcohol with approximate 12 beers every 1 to 2 months.  No history of drugs.  Recommended updated labs, right upper quadrant ultrasound elastography, colonoscopy, possible add-on endoscopy if apparent liver scarring, follow-up in 6 to 8 weeks.  Labs were ordered on 02/01/2019 including CBC, HFP, BMP, hepatitis B serologies.  It does not appear these have been completed.  It was noticed, for some reason, the hepatitis C serologies had not been entered.  Right upper quadrant ultrasound elastography completed 02/09/2019 which found mild diffuse hepatic steatosis with no liver mass and diagnostic category less than 5K PA with high probability of being normal.  Specifically there is mildly increased echogenicity of the hepatic parenchyma consistent with steatosis.  Today he states he's doing ok overall. Having some mild, intermittent left-sided discomfort which started initially a year ago which was felt related to his back at that time. Hasn't had significant improvement and still has back issues. Denies N/V, hematochezia, melena, fever, chills, unintentional weight loss. Denies yellowing of skin/eyes, darkened urine,  tremors/shakes, acute episodic confusion, generalized pruritis, LE edema. Denies URI or flu-like symptoms. Denies loss of sense of taste or smell. Denies chest pain, dyspnea, dizziness, lightheadedness, syncope, near syncope. Denies any other upper or lower GI symptoms.   Past Medical History:  Diagnosis Date  . Tibial plateau fracture, right     Past Surgical History:  Procedure Laterality Date  . APPENDECTOMY    . ORIF TIBIA PLATEAU Right 10/25/2014   Procedure: OPEN REDUCTION INTERNAL FIXATION (ORIF) RIGHT TIBIAL PLATEAU;  Surgeon: Renette Butters, MD;  Location: [facility];  Service: Orthopedics;  Laterality: Right;    Current Outpatient Medications  Medication Sig Dispense Refill  . polyethylene glycol-electrolytes (NULYTELY/GOLYTELY) 420 g solution As directed (Patient not taking: Reported on 04/05/2019) 4000 mL 0   No current facility-administered medications for this visit.    Allergies as of 04/05/2019  . (No Known Allergies)    Family History  Problem Relation Age of Onset  . Breast cancer Mother   . Prostate cancer Brother   . Colon cancer Neg Hx   . Gastric cancer Neg Hx   . Esophageal cancer Neg Hx   . Liver disease Neg Hx     Social History   Socioeconomic History  . Marital status: Married    Spouse name: Not on file  . Number of children: Not on file  . Years of education: Not on file  . Highest education level: Not on file  Occupational History  . Not on file  Tobacco Use  . Smoking status: Former Research scientist (life sciences)  . Smokeless tobacco: Never Used  . Tobacco comment: intermittently; not daily smoking  Substance and Sexual Activity  . Alcohol use: Not Currently    Comment: social (maybe 12 beers over 1-2 months); denied 02/02/19  . Drug use: No  . Sexual activity: Not on file  Other Topics Concern  . 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   Social Determinants of Health   Financial  Resource Strain:   . Difficulty of Paying Living Expenses: Not on file  Food Insecurity:   . Worried About Charity fundraiser in the Last Year: Not on file  . Ran Out of Food in the Last Year: Not on file  Transportation Needs:   . Lack of Transportation (Medical): Not on file  . Lack of Transportation (Non-Medical): Not on file  Physical Activity:   . Days of Exercise per Week: Not on file  . Minutes of Exercise per Session: Not on file  Stress:   . Feeling of Stress : Not on file  Social Connections:   . Frequency of Communication with Friends and Family: Not on file  . Frequency of Social Gatherings with Friends and Family: Not on file  . Attends Religious Services: Not on file  . Active Member of Clubs or Organizations: Not on file  . Attends Archivist Meetings: Not on file  . Marital Status: Not on file    Review of Systems: General: Negative for anorexia, weight loss, fever, chills, fatigue, weakness. ENT: Negative for hoarseness, difficulty swallowing. CV: Negative for chest pain, angina, palpitations, peripheral edema.  Respiratory: Negative for dyspnea at rest, cough, sputum, wheezing.  GI: See history of present illness. MS: Admits history of back pain.  Derm: Negative for rash or itching.  Neuro: Negative for memory loss, confusion.  Endo: Negative for unusual weight change.  Heme: Negative for bruising or bleeding. Allergy: Negative for rash or hives.   Physical Exam: BP 123/82   Pulse 68   Temp (!) 97.1 F (36.2 C) (Temporal)   Ht 6' (1.829 m)   Wt 211 lb 9.6 oz (96 kg)   BMI 28.70 kg/m  General:   Alert and oriented. Pleasant and cooperative. Well-nourished and well-developed.  Eyes:  Without icterus, sclera clear and conjunctiva pink.  Ears:  Normal auditory acuity. Cardiovascular:  S1, S2 present without murmurs appreciated. Extremities without clubbing or edema. Respiratory:  Clear to auscultation bilaterally. No wheezes, rales, or  rhonchi. No distress.  Gastrointestinal:  +BS, soft, non-tender and non-distended. No HSM noted. No guarding or rebound. No masses appreciated.  Rectal:  Deferred  Musculoskalatal:  Symmetrical without gross deformities. Neurologic:  Alert and oriented x4;  grossly normal neurologically. Psych:  Alert and cooperative. Normal mood and affect. Heme/Lymph/Immune: No excessive bruising noted.    04/05/2019 9:32 AM   Disclaimer: This note was dictated with voice recognition software. Similar sounding words can inadvertently be transcribed and may not be corrected upon review.

## 2019-04-05 NOTE — Assessment & Plan Note (Signed)
The patient had previously abnormal CT with concerns for possible cirrhosis.  Upper quadrant ultrasound with diagnostic category [PA indicated likely normal.  He did find hepatic steatosis.  He likely has fatty liver rather than cirrhosis.  You had his labs drawn in the next few days.  Further recommendations to follow.  We can discuss treatment of fatty liver disease at his next visit although I did give an overview today.

## 2019-04-05 NOTE — Progress Notes (Signed)
Cc'ed to pcp °

## 2019-04-05 NOTE — Patient Instructions (Addendum)
Your health issues we discussed today were:   Need for colonoscopy: 1. After colonoscopy completed on March 1 2. Call us in mid February if your pharmacy has not stopped your bowel prep at which point we can send it to a different pharmacy 3. Further recommendations will follow your colonoscopy  Abnormal CT of your liver: 1. As we discussed, your ultrasound shows likely fatty liver disease rather than cirrhosis 2. Have your labs drawn when you are able to.  I have added onto new labs.  We will call you when we get results 3. And printing information below about fatty liver disease that she can start reviewing prior to your next visit. 4. Call us if you have any concerning symptoms  Overall I recommend:  1. Continue your other current medications 2. Return for follow-up in 6 months 3. Call us if you have any questions or concerns   ---------------------------------------------------------------  COVID-19 Vaccine Information can be found at: [email or web address] For questions related to vaccine distribution or appointments, please email [email or web address] or call [phone].   ---------------------------------------------------------------   At [facility] we value your feedback. You may receive a survey about your visit today. Please share your experience as we strive to create trusting relationships with our patients to provide genuine, compassionate, quality care.  We appreciate your understanding and patience as we review any laboratory studies, imaging, and other diagnostic tests that are ordered as we care for you. Our office policy is 5 business days for review of these results, and any emergent or urgent results are addressed in a timely manner for your best interest. If you do not hear from our office in 1 week, please contact us.   We also encourage the use of MyChart, which contains your medical  information for your review as well. If you are not enrolled in this feature, an access code is on this after visit summary for your convenience. Thank you for allowing [place] to be involved in your care.  It was great to see you today!  I hope you have a great day!!      Fatty Liver Disease  Fatty liver disease occurs when too much fat has built up in your liver cells. Fatty liver disease is also called hepatic steatosis or steatohepatitis. The liver removes harmful substances from your bloodstream and produces fluids that your body needs. It also helps your body use and store energy from the food you eat. In many cases, fatty liver disease does not cause symptoms or problems. It is often diagnosed when tests are being done for other reasons. However, over time, fatty liver can cause inflammation that may lead to more serious liver problems, such as scarring of the liver (cirrhosis) and liver failure. Fatty liver is associated with insulin resistance, increased body fat, high blood pressure (hypertension), and high cholesterol. These are features of metabolic syndrome and increase your risk for stroke, diabetes, and heart disease. What are the causes? This condition may be caused by:  Drinking too much alcohol.  Poor nutrition.  Obesity.  Cushing's syndrome.  Diabetes.  High cholesterol.  Certain drugs.  Poisons.  Some viral infections.  Pregnancy. What increases the risk? You are more likely to develop this condition if you:  Abuse alcohol.  Are overweight.  Have diabetes.  Have hepatitis.  Have a high triglyceride level.  Are pregnant. What are the signs or symptoms? Fatty liver disease often does not cause symptoms. If symptoms do develop, they can include:  Fatigue.  Weakness.  Weight loss.  Confusion.  Abdominal pain.  Nausea and vomiting.  Yellowing of your skin and the white parts of your eyes (jaundice).  Itchy skin. How is this diagnosed? This  condition may be diagnosed by:  A physical exam and medical history.  Blood tests.  Imaging tests, such as an ultrasound, CT scan, or MRI.  A liver biopsy. A small sample of liver tissue is removed using a needle. The sample is then looked at under a microscope. How is this treated? Fatty liver disease is often caused by other health conditions. Treatment for fatty liver may involve medicines and lifestyle changes to manage conditions such as:  Alcoholism.  High cholesterol.  Diabetes.  Being overweight or obese. Follow these instructions at home:   Do not drink alcohol. If you have trouble quitting, ask your health care provider how to safely quit with the help of medicine or a supervised program. This is important to keep your condition from getting worse.  Eat a healthy diet as told by your health care provider. Ask your health care provider about working with a diet and nutrition specialist (dietitian) to develop an eating plan.  Exercise regularly. This can help you lose weight and control your cholesterol and diabetes. Talk to your health care provider about an exercise plan and which activities are best for you.  Take over-the-counter and prescription medicines only as told by your health care provider.  Keep all follow-up visits as told by your health care provider. This is important. Contact a health care provider if: You have trouble controlling your:  Blood sugar. This is especially important if you have diabetes.  Cholesterol.  Drinking of alcohol. Get help right away if:  You have abdominal pain.  You have jaundice.  You have nausea and vomiting.  You vomit blood or material that looks like coffee grounds.  You have stools that are black, tar-like, or bloody. Summary  Fatty liver disease develops when too much fat builds up in the cells of your liver.  Fatty liver disease often causes no symptoms or problems. However, over time, fatty liver can cause  inflammation that may lead to more serious liver problems, such as scarring of the liver (cirrhosis).  You are more likely to develop this condition if you abuse alcohol, are pregnant, are overweight, have diabetes, have hepatitis, or have high triglyceride levels.  Contact your health care provider if you have trouble controlling your weight, blood sugar, cholesterol, or drinking of alcohol. This information is not intended to replace advice given to you by your health care provider. Make sure you discuss any questions you have with your health care provider. Document Revised: 01/29/2017 Document Reviewed: 11/25/2016 Elsevier Patient Education  2020 [place].    Nonalcoholic Fatty Liver Disease Diet, Adult Nonalcoholic fatty liver disease is a condition that causes fat to build up in and around the liver. The disease makes it harder for the liver to work the way that it should. Following a healthy diet can help to keep nonalcoholic fatty liver disease under control. It can also help to prevent or improve conditions that are associated with the disease, such as heart disease, diabetes, high blood pressure, and abnormal cholesterol levels. Along with regular exercise, this diet:  Promotes weight loss.  Helps to control blood sugar levels.  Helps to improve the way that the body uses insulin. What are tips for following this plan? Reading food labels Always check food labels for:  The amount  of saturated fat in a food. You should limit your intake of saturated fat. Saturated fat is found in foods that come from animals, including meat and dairy products such as butter, cheese, and whole milk.  The amount of fiber in a food. You should choose high-fiber foods such as fruits, vegetables, and whole grains. Try to get 25-30 grams (g) of fiber a day.  Cooking  When cooking, use heart-healthy oils that are high in monounsaturated fats. These include olive oil, canola oil, and avocado  oil.  Limit frying or deep-frying foods. Cook foods using healthy methods such as baking, boiling, steaming, and grilling instead. Meal planning  You may want to keep track of how many calories you take in. Eating the right amount of calories will help you achieve a healthy weight. Meeting with a registered dietitian can help you get started.  Limit how often you eat takeout and fast food. These foods are usually very high in fat, salt, and sugar.  Use the glycemic index (GI) to plan your meals. The index tells you how quickly a food will raise your blood sugar. Choose low-GI foods (GI less than 55). These foods take a longer time to raise blood sugar. A registered dietitian can help you identify foods lower on the GI scale. Lifestyle  You may want to follow a Mediterranean diet. This diet includes a lot of vegetables, lean meats or fish, whole grains, fruits, and healthy oils and fats. What foods can I eat?  Fruits Bananas. Apples. Oranges. Grapes. Papaya. Mango. Pomegranate. Kiwi. Grapefruit. Cherries. Vegetables Lettuce. Spinach. Peas. Beets. Cauliflower. Cabbage. Broccoli. Carrots. Tomatoes. Squash. Eggplant. Herbs. Peppers. Onions. Cucumbers. Brussels sprouts. Yams and sweet potatoes. Beans. Lentils. Grains Whole wheat or whole-grain foods, including breads, crackers, cereals, and pasta. Stone-ground whole wheat. Unsweetened oatmeal. Bulgur. Barley. Quinoa. Brown or wild rice. Corn or whole wheat flour tortillas. Meats and other proteins Lean meats. Poultry. Tofu. Seafood and shellfish. Dairy Low-fat or fat-free dairy products, such as yogurt, cottage cheese, or cheese. Beverages Water. Sugar-free drinks. Tea. Coffee. Low-fat or skim milk. Milk alternatives, such as soy or almond milk. Real fruit juice. Fats and oils Avocado. Canola or olive oil. Nuts and nut butters. Seeds. Seasonings and condiments Mustard. Relish. Low-fat, low-sugar ketchup and barbecue sauce. Low-fat or  fat-free mayonnaise. Sweets and desserts Sugar-free sweets. The items listed above may not be a complete list of foods and beverages you can eat. Contact a dietitian for more information. What foods should I limit or avoid? Meats and other proteins Limit red meat to 1-2 times a week. Dairy [employer]. Fats and oils Palm oil and coconut oil. Fried foods. Other foods Processed foods. Foods that contain a lot of salt or sodium. Sweets and desserts Sweets that contain sugar. Beverages Sweetened drinks, such as sweet tea, milkshakes, iced sweet drinks, and sodas. Alcohol. The items listed above may not be a complete list of foods and beverages you should avoid. Contact a dietitian for more information. Where to find more information The [employer] of Diabetes and Digestive and Kidney Diseases: [employer].is Summary  Nonalcoholic fatty liver disease is a condition that causes fat to build up in and around the liver.  Following a healthy diet can help to keep nonalcoholic fatty liver disease under control. Your diet should be rich in fruits, vegetables, whole grains, and lean proteins.  Limit your intake of saturated fat. Saturated fat is found in foods that come from animals, including meat and dairy products such  as butter, cheese, and whole milk.  This diet promotes weight loss, helps to control blood sugar levels, and helps to improve the way that the body uses insulin. This information is not intended to replace advice given to you by your health care provider. Make sure you discuss any questions you have with your health care provider. Document Revised: 06/10/2018 Document Reviewed: 03/10/2018 Elsevier Patient Education  [place].

## 2019-04-05 NOTE — Assessment & Plan Note (Signed)
Currently due for colonoscopy and he does have an appointment scheduled for 05/01/2019.  His pharmacy is awaiting bowel prep as it was out of stock.  I recommended that he follow-up by mid February and if it has not been stopped then to call our office and we can send a prescription to a different pharmacy.  He verbalized understanding.  Further recommendations to follow.

## 2019-04-17 LAB — CBC WITH DIFFERENTIAL/PLATELET
Absolute Monocytes: 819 cells/uL (ref 200–950)
Basophils Absolute: 42 cells/uL (ref 0–200)
Basophils Relative: 0.6 %
Eosinophils Absolute: 147 cells/uL (ref 15–500)
Eosinophils Relative: 2.1 %
HCT: 47.3 % (ref 38.5–50.0)
Hemoglobin: 16.5 g/dL (ref 13.2–17.1)
Lymphs Abs: 2408 cells/uL (ref 850–3900)
MCH: 30.3 pg (ref 27.0–33.0)
MCHC: 34.9 g/dL (ref 32.0–36.0)
MCV: 86.8 fL (ref 80.0–100.0)
MPV: 9.7 fL (ref 7.5–12.5)
Monocytes Relative: 11.7 %
Neutro Abs: 3584 cells/uL (ref 1500–7800)
Neutrophils Relative %: 51.2 %
Platelets: 214 10*3/uL (ref 140–400)
RBC: 5.45 10*6/uL (ref 4.20–5.80)
RDW: 12.3 % (ref 11.0–15.0)
Total Lymphocyte: 34.4 %
WBC: 7 10*3/uL (ref 3.8–10.8)

## 2019-04-17 LAB — BASIC METABOLIC PANEL
BUN: 16 mg/dL (ref 7–25)
CO2: 30 mmol/L (ref 20–32)
Calcium: 9.5 mg/dL (ref 8.6–10.3)
Chloride: 104 mmol/L (ref 98–110)
Creat: 1.08 mg/dL (ref 0.70–1.33)
Glucose, Bld: 77 mg/dL (ref 65–139)
Potassium: 4 mmol/L (ref 3.5–5.3)
Sodium: 140 mmol/L (ref 135–146)

## 2019-04-17 LAB — HEPATIC FUNCTION PANEL
AG Ratio: 2.1 (calc) (ref 1.0–2.5)
ALT: 36 U/L (ref 9–46)
AST: 22 U/L (ref 10–35)
Albumin: 4.6 g/dL (ref 3.6–5.1)
Alkaline phosphatase (APISO): 77 U/L (ref 35–144)
Bilirubin, Direct: 0.2 mg/dL (ref 0.0–0.2)
Globulin: 2.2 g/dL (calc) (ref 1.9–3.7)
Indirect Bilirubin: 0.9 mg/dL (calc) (ref 0.2–1.2)
Total Bilirubin: 1.1 mg/dL (ref 0.2–1.2)
Total Protein: 6.8 g/dL (ref 6.1–8.1)

## 2019-04-17 LAB — HEPATITIS B SURFACE ANTIBODY,QUALITATIVE: Hep B S Ab: NONREACTIVE

## 2019-04-17 LAB — HEPATITIS B SURFACE ANTIGEN: Hepatitis B Surface Ag: NONREACTIVE

## 2019-04-17 LAB — HEPATITIS C ANTIBODY
Hepatitis C Ab: NONREACTIVE
SIGNAL TO CUT-OFF: 0.01 (ref <1.00)

## 2019-04-17 LAB — HEPATITIS B CORE ANTIBODY, TOTAL: Hep B Core Total Ab: NONREACTIVE

## 2019-04-17 LAB — HEPATITIS C RNA QUANTITATIVE
HCV Quantitative Log: <1.18 Log IU/mL
HCV RNA, PCR, QN: <15 IU/mL

## 2019-04-19 ENCOUNTER — Telehealth: Payer: Self-pay

## 2019-04-19 NOTE — Telephone Encounter (Signed)
Pt called office, Tri-lyte unavailable. Miralax instructions mailed. He also has MyChart.

## 2019-04-28 ENCOUNTER — Other Ambulatory Visit (HOSPITAL_COMMUNITY)
Admission: RE | Admit: 2019-04-28 | Discharge: 2019-04-28 | Disposition: A | Discharge status: Home / Self Care | Payer: Commercial Managed Care - PPO | Source: Ambulatory Visit | Attending: Gastroenterology | Admitting: Gastroenterology

## 2019-04-28 ENCOUNTER — Other Ambulatory Visit: Payer: Self-pay

## 2019-04-28 DIAGNOSIS — Z01812 Encounter for preprocedural laboratory examination: Secondary | ICD-10-CM | POA: Insufficient documentation

## 2019-04-28 DIAGNOSIS — Z20822 Contact with and (suspected) exposure to covid-19: Secondary | ICD-10-CM | POA: Insufficient documentation

## 2019-04-28 LAB — SARS CORONAVIRUS 2 (TAT 6-24 HRS): SARS Coronavirus 2: NEGATIVE

## 2019-05-01 ENCOUNTER — Encounter (HOSPITAL_COMMUNITY)
Admission: RE | Disposition: A | Discharge status: Home / Self Care | Payer: Self-pay | Source: Home / Self Care | Attending: Gastroenterology

## 2019-05-01 ENCOUNTER — Encounter (HOSPITAL_COMMUNITY): Payer: Self-pay | Admitting: Gastroenterology

## 2019-05-01 ENCOUNTER — Ambulatory Visit (HOSPITAL_COMMUNITY)
Admission: RE | Admit: 2019-05-01 | Discharge: 2019-05-01 | Disposition: A | Discharge status: Home / Self Care | Payer: Commercial Managed Care - PPO | Attending: Gastroenterology | Admitting: Gastroenterology

## 2019-05-01 ENCOUNTER — Other Ambulatory Visit: Payer: Self-pay

## 2019-05-01 DIAGNOSIS — D125 Benign neoplasm of sigmoid colon: Secondary | ICD-10-CM | POA: Insufficient documentation

## 2019-05-01 DIAGNOSIS — Q438 Other specified congenital malformations of intestine: Secondary | ICD-10-CM | POA: Diagnosis not present

## 2019-05-01 DIAGNOSIS — K644 Residual hemorrhoidal skin tags: Secondary | ICD-10-CM | POA: Insufficient documentation

## 2019-05-01 DIAGNOSIS — D123 Benign neoplasm of transverse colon: Secondary | ICD-10-CM | POA: Insufficient documentation

## 2019-05-01 DIAGNOSIS — Z87891 Personal history of nicotine dependence: Secondary | ICD-10-CM | POA: Diagnosis not present

## 2019-05-01 DIAGNOSIS — K648 Other hemorrhoids: Secondary | ICD-10-CM | POA: Insufficient documentation

## 2019-05-01 DIAGNOSIS — K635 Polyp of colon: Secondary | ICD-10-CM

## 2019-05-01 DIAGNOSIS — R932 Abnormal findings on diagnostic imaging of liver and biliary tract: Secondary | ICD-10-CM

## 2019-05-01 DIAGNOSIS — Z1211 Encounter for screening for malignant neoplasm of colon: Secondary | ICD-10-CM | POA: Insufficient documentation

## 2019-05-01 HISTORY — PX: BIOPSY: SHX5522

## 2019-05-01 HISTORY — PX: ESOPHAGOGASTRODUODENOSCOPY: SHX5428

## 2019-05-01 HISTORY — PX: COLONOSCOPY: SHX5424

## 2019-05-01 SURGERY — COLONOSCOPY
Anesthesia: Moderate Sedation

## 2019-05-01 MED ORDER — MEPERIDINE HCL 100 MG/ML IJ SOLN
INTRAMUSCULAR | Status: AC
  Filled 2019-05-01: qty 2

## 2019-05-01 MED ORDER — MIDAZOLAM HCL 5 MG/5ML IJ SOLN
INTRAMUSCULAR | Status: DC | PRN
  Administered 2019-05-01: 1 mg via INTRAVENOUS
  Administered 2019-05-01 (×2): 2 mg via INTRAVENOUS
  Administered 2019-05-01: 1 mg via INTRAVENOUS

## 2019-05-01 MED ORDER — MIDAZOLAM HCL 5 MG/5ML IJ SOLN
INTRAMUSCULAR | Status: AC
  Filled 2019-05-01: qty 10

## 2019-05-01 MED ORDER — SODIUM CHLORIDE 0.9 % IV SOLN: INTRAVENOUS | Status: DC

## 2019-05-01 MED ORDER — MEPERIDINE HCL 100 MG/ML IJ SOLN
INTRAMUSCULAR | Status: DC | PRN
  Administered 2019-05-01: 25 mg
  Administered 2019-05-01: 50 mg
  Administered 2019-05-01: 25 mg

## 2019-05-01 NOTE — Discharge Instructions (Signed)
You had 2 small polyps removed. You have internal AND EXTERNAL hemorrhoids.   DRINK WATER TO KEEP YOUR URINE LIGHT YELLOW.  FOLLOW A HIGH FIBER DIET. AVOID ITEMS THAT CAUSE BLOATING & GAS. SEE INFO BELOW.   USE PREPARATION H FOUR TIMES  A DAY IF NEEDED TO RELIEVE RECTAL PAIN/PRESSURE/BLEEDING.    YOUR BIOPSY RESULTS WILL BE BACK IN 5 BUSINESS DAYS.  Next colonoscopy in 5-10 years.    Colonoscopy Care After Read the instructions outlined below and refer to this sheet in the next week. These discharge instructions provide you with general information on caring for yourself after you leave the hospital. While your treatment has been planned according to the most current medical practices available, unavoidable complications occasionally occur. If you have any problems or questions after discharge, call DR. Asher Torpey, [phone].  ACTIVITY  You may resume your regular activity, but move at a slower pace for the next 24 hours.   Take frequent rest periods for the next 24 hours.   Walking will help get rid of the air and reduce the bloated feeling in your belly (abdomen).   No driving for 24 hours (because of the medicine (anesthesia) used during the test).   You may shower.   Do not sign any important legal documents or operate any machinery for 24 hours (because of the anesthesia used during the test).    NUTRITION  Drink plenty of fluids.   You may resume your normal diet as instructed by your doctor.   Begin with a light meal and progress to your normal diet. Heavy or fried foods are harder to digest and may make you feel sick to your stomach (nauseated).   Avoid alcoholic beverages for 24 hours or as instructed.    MEDICATIONS  You may resume your normal medications.   WHAT YOU CAN EXPECT TODAY  Some feelings of bloating in the abdomen.   Passage of more gas than usual.   Spotting of blood in your stool or on the toilet paper  .  IF YOU HAD POLYPS REMOVED  DURING THE COLONOSCOPY:  Eat a soft diet IF YOU HAVE NAUSEA, BLOATING, ABDOMINAL PAIN, OR VOMITING.    FINDING OUT THE RESULTS OF YOUR TEST Not all test results are available during your visit. DR. Oneida Alar WILL CALL YOU WITHIN 14 DAYS OF YOUR PROCEDUE WITH YOUR RESULTS. Do not assume everything is normal if you have not heard from DR. Aariyana Manz, CALL HER OFFICE AT [phone].  SEEK IMMEDIATE MEDICAL ATTENTION AND CALL THE OFFICE: [phone] IF:  You have more than a spotting of blood in your stool.   Your belly is swollen (abdominal distention).   You are nauseated or vomiting.   You have a temperature over 101F.   You have abdominal pain or discomfort that is severe or gets worse throughout the day.   High-Fiber Diet A high-fiber diet changes your normal diet to include more whole grains, legumes, fruits, and vegetables. Changes in the diet involve replacing refined carbohydrates with unrefined foods. The calorie level of the diet is essentially unchanged. The Dietary Reference Intake (recommended amount) for adult males is 38 grams per day. For adult females, it is 25 grams per day. Pregnant and lactating women should consume 28 grams of fiber per day. Fiber is the intact part of a plant that is not broken down during digestion. Functional fiber is fiber that has been isolated from the plant to provide a beneficial effect in the body.  PURPOSE  Increase stool bulk.   Ease and regulate bowel movements.   Lower cholesterol.   REDUCE RISK OF COLON CANCER  INDICATIONS THAT YOU NEED MORE FIBER  Constipation and hemorrhoids.   Uncomplicated diverticulosis (intestine condition) and irritable bowel syndrome.   Weight management.   As a protective measure against hardening of the arteries (atherosclerosis), diabetes, and cancer.   GUIDELINES FOR INCREASING FIBER IN THE DIET  Start adding fiber to the diet slowly. A gradual increase of about 5 more grams (2 servings of most  fruits or vegetables) per day is best. Too rapid an increase in fiber may result in constipation, flatulence, and bloating.   Drink enough water and fluids to keep your urine clear or pale yellow. Water, juice, or caffeine-free drinks are recommended. Not drinking enough fluid may cause constipation.   Eat a variety of high-fiber foods rather than one type of fiber.   Try to increase your intake of fiber through using high-fiber foods rather than fiber pills or supplements that contain small amounts of fiber.   The goal is to change the types of food eaten. Do not supplement your present diet with high-fiber foods, but replace foods in your present diet.    Polyps, Colon  A polyp is extra tissue that grows inside your body. Colon polyps grow in the large intestine. The large intestine, also called the colon, is part of your digestive system. It is a long, hollow tube at the end of your digestive tract where your body makes and stores stool. Most polyps are not dangerous. They are benign. This means they are not cancerous. But over time, some types of polyps can turn into cancer. Polyps that are smaller than a pea are usually not harmful. But larger polyps could someday become or may already be cancerous. To be safe, doctors remove all polyps and test them.   WHO GETS POLYPS? Anyone can get polyps, but certain people are more likely than others. You may have a greater chance of getting polyps if:  You are over 50.   You have had polyps before.   Someone in your family has had polyps.   Someone in your family has had cancer of the large intestine.   Find out if someone in your family has had polyps. You may also be more likely to get polyps if you:   Eat a lot of fatty foods   Smoke   Drink alcohol   Do not exercise  Eat too much   PREVENTION There is not one sure way to prevent polyps. You might be able to lower your risk of getting them if you:  Eat more fruits and vegetables and  less fatty food.   Do not smoke.   Avoid alcohol.   Exercise every day.   Lose weight if you are overweight.   Eating more calcium and folate can also lower your risk of getting polyps. Some foods that are rich in calcium are milk, cheese, and broccoli. Some foods that are rich in folate are chickpeas, kidney beans, and spinach.

## 2019-05-01 NOTE — Op Note (Signed)
[facility] Patient Name: Gordon Johnson Procedure Date: 05/01/2019 12:38 PM MRN: [id number] Date of Birth: Jan 30, 1966 Attending MD: Barney Drain MD, MD CSN: [id number] Age: 54 Admit Type: Outpatient Procedure:                Colonoscopy WITH COLD FORCEPS POLYPECTOMY Indications:              Screening for colorectal malignant neoplasm Providers:                Barney Drain MD, MD, Otis Peak B. Sharon Seller, RN, Aram Candela Referring MD:             Doree Albee MD, MD Medicines:                Meperidine 100 mg IV, Midazolam 6 mg IV Complications:            No immediate complications. Estimated Blood Loss:     Estimated blood loss was minimal. Procedure:                Pre-Anesthesia Assessment:                           - Prior to the procedure, a History and Physical                            was performed, and patient medications and                            allergies were reviewed. The patient's tolerance of                            previous anesthesia was also reviewed. The risks                            and benefits of the procedure and the sedation                            options and risks were discussed with the patient.                            All questions were answered, and informed consent                            was obtained. Prior Anticoagulants: The patient has                            taken no previous anticoagulant or antiplatelet                            agents. ASA Grade Assessment: I - A normal, healthy                            patient. After reviewing the risks and benefits,  the patient was deemed in satisfactory condition to                            undergo the procedure. After obtaining informed                            consent, the colonoscope was passed under direct                            vision. Throughout the procedure, the patient's                            blood  pressure, pulse, and oxygen saturations were                            monitored continuously. The [id number]                            scope was introduced through the anus and advanced                            to the the cecum, identified by appendiceal orifice                            and ileocecal valve. The colonoscopy was somewhat                            difficult due to a tortuous colon. Successful                            completion of the procedure was aided by                            straightening and shortening the scope to obtain                            bowel loop reduction and COLOWRAP. The patient                            tolerated the procedure well. The quality of the                            bowel preparation was excellent. The ileocecal                            valve, appendiceal orifice, and rectum were                            photographed. Scope In: 1:29:22 PM Scope Out: 1:50:22 PM Scope Withdrawal Time: 0 hours 18 minutes 38 seconds  Total Procedure Duration: 0 hours 21 minutes 0 seconds  Findings:      Two sessile polyps were found in the sigmoid colon and hepatic flexure.       The polyps were 2  to 4 mm in size. These polyps were removed with a cold       biopsy forceps. Resection and retrieval were complete.      External and internal hemorrhoids were found.      The recto-sigmoid colon and sigmoid colon were mildly tortuous. Impression:               - Two 2 to 4 mm polyps in the sigmoid colon and at                            the hepatic flexure, removed with a cold biopsy                            forceps. Resected and retrieved.                           - External and internal hemorrhoids.                           - Tortuous colon. Moderate Sedation:      Moderate (conscious) sedation was administered by the endoscopy nurse       and supervised by the endoscopist. The following parameters were       monitored: oxygen  saturation, heart rate, blood pressure, and response       to care. Total physician intraservice time was 44 minutes. Recommendation:           - Patient has a contact number available for                            emergencies. The signs and symptoms of potential                            delayed complications were discussed with the                            patient. Return to normal activities tomorrow.                            Written discharge instructions were provided to the                            patient.                           - High fiber diet.                           - Continue present medications.                           - Await pathology results.                           - Repeat colonoscopy in 5-10 years for surveillance. Procedure Code(s):        --- Professional ---  [id number], Colonoscopy, flexible; with biopsy, single                            or multiple                           99153, Moderate sedation; each additional 15                            minutes intraservice time                           99153, Moderate sedation; each additional 15                            minutes intraservice time                           G0500, Moderate sedation services provided by the                            same physician or other qualified health care                            professional performing a gastrointestinal                            endoscopic service that sedation supports,                            requiring the presence of an independent trained                            observer to assist in the monitoring of the                            patient's level of consciousness and physiological                            status; initial 15 minutes of intra-service time;                            patient age 43 years or older (additional time may                            be reported with [phone], as appropriate) Diagnosis  Code(s):        --- Professional ---                           Z12.11, Encounter for screening for malignant                            neoplasm of colon                           K63.5, Polyp of colon  K64.8, Other hemorrhoids                           Q43.8, Other specified congenital malformations of                            intestine CPT copyright 2019 [employer]. All rights reserved. The codes documented in this report are preliminary and upon coder review may  be revised to meet current compliance requirements. Barney Drain, MD Barney Drain MD, MD 05/01/2019 2:06:34 PM This report has been signed electronically. Number of Addenda: 0

## 2019-05-01 NOTE — H&P (Signed)
Primary Care Physician:  Doree Albee, MD Primary Gastroenterologist:  Dr. Oneida Alar  Pre-Procedure History & Physical: HPI:  Gordon Johnson is a 54 y.o. male here for COLON CANCER SCREENING.  Past Medical History:  Diagnosis Date  . Tibial plateau fracture, right     Past Surgical History:  Procedure Laterality Date  . APPENDECTOMY    . ORIF TIBIA PLATEAU Right 10/25/2014   Procedure: OPEN REDUCTION INTERNAL FIXATION (ORIF) RIGHT TIBIAL PLATEAU;  Surgeon: Renette Butters, MD;  Location: [place];  Service: Orthopedics;  Laterality: Right;    Prior to Admission medications   Medication Sig Start Date End Date Taking? Authorizing Provider  polyethylene glycol-electrolytes (NULYTELY/GOLYTELY) 420 g solution As directed 02/02/19   Danie Binder, MD    Allergies as of 02/02/2019  . (No Known Allergies)    Family History  Problem Relation Age of Onset  . Breast cancer Mother   . Prostate cancer Brother   . Colon cancer Neg Hx   . Gastric cancer Neg Hx   . Esophageal cancer Neg Hx   . Liver disease Neg Hx     Social History   Socioeconomic History  . Marital status: Married    Spouse name: Not on file  . Number of children: Not on file  . Years of education: Not on file  . Highest education level: Not on file  Occupational History  . Not on file  Tobacco Use  . Smoking status: Former Research scientist (life sciences)  . Smokeless tobacco: Never Used  . Tobacco comment: intermittently; not daily smoking  Substance and Sexual Activity  . Alcohol use: Not Currently    Comment: social (maybe 12 beers over 1-2 months); denied 02/02/19  . Drug use: No  . Sexual activity: Not on file  Other Topics Concern  . 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   Social Determinants of Health   Financial Resource Strain:   . Difficulty of Paying Living Expenses: Not on file  Food Insecurity:   . Worried About Charity fundraiser in  the Last Year: Not on file  . Ran Out of Food in the Last Year: Not on file  Transportation Needs:   . Lack of Transportation (Medical): Not on file  . Lack of Transportation (Non-Medical): Not on file  Physical Activity:   . Days of Exercise per Week: Not on file  . Minutes of Exercise per Session: Not on file  Stress:   . Feeling of Stress : Not on file  Social Connections:   . Frequency of Communication with Friends and Family: Not on file  . Frequency of Social Gatherings with Friends and Family: Not on file  . Attends Religious Services: Not on file  . Active Member of Clubs or Organizations: Not on file  . Attends Archivist Meetings: Not on file  . Marital Status: Not on file  Intimate Partner Violence:   . Fear of Current or Ex-Partner: Not on file  . Emotionally Abused: Not on file  . Physically Abused: Not on file  . Sexually Abused: Not on file    Review of Systems: See HPI, otherwise negative ROS   Physical Exam: BP (!) 128/94   Pulse 84   Temp 98.1 F (36.7 C) (Oral)   Resp 16   Ht 6' (1.829 m)   Wt 90.7 kg   SpO2 95%   BMI 27.12 kg/m  General:  Alert,  pleasant and cooperative in NAD Head:  Normocephalic and atraumatic. Neck:  Supple; Lungs:  Clear throughout to auscultation.    Heart:  Regular rate and rhythm. Abdomen:  Soft, nontender and nondistended. Normal bowel sounds, without guarding, and without rebound.   Neurologic:  Alert and  oriented x4;  grossly normal neurologically.  Impression/Plan:    SCREENING  Plan:  1. TCS TODAY DISCUSSED PROCEDURE, BENEFITS, & RISKS: < 1% chance of medication reaction, bleeding, perforation, ASPIRATION, or rupture of spleen/liver requiring surgery to fix it and missed polyps < 1 cm 10-20% of the time.

## 2019-05-03 LAB — SURGICAL PATHOLOGY

## 2019-08-07 ENCOUNTER — Encounter: Payer: Self-pay | Admitting: Gastroenterology

## 2019-08-23 IMAGING — DX LUMBAR SPINE - COMPLETE 4+ VIEW
5 series · 5 of 5 positions shown · non-contrast
Comparison: MRI 05/12/2017

CLINICAL DATA: Low back pain several months.

EXAM:
LUMBAR SPINE - COMPLETE 4+ VIEW

[l-spine ap]
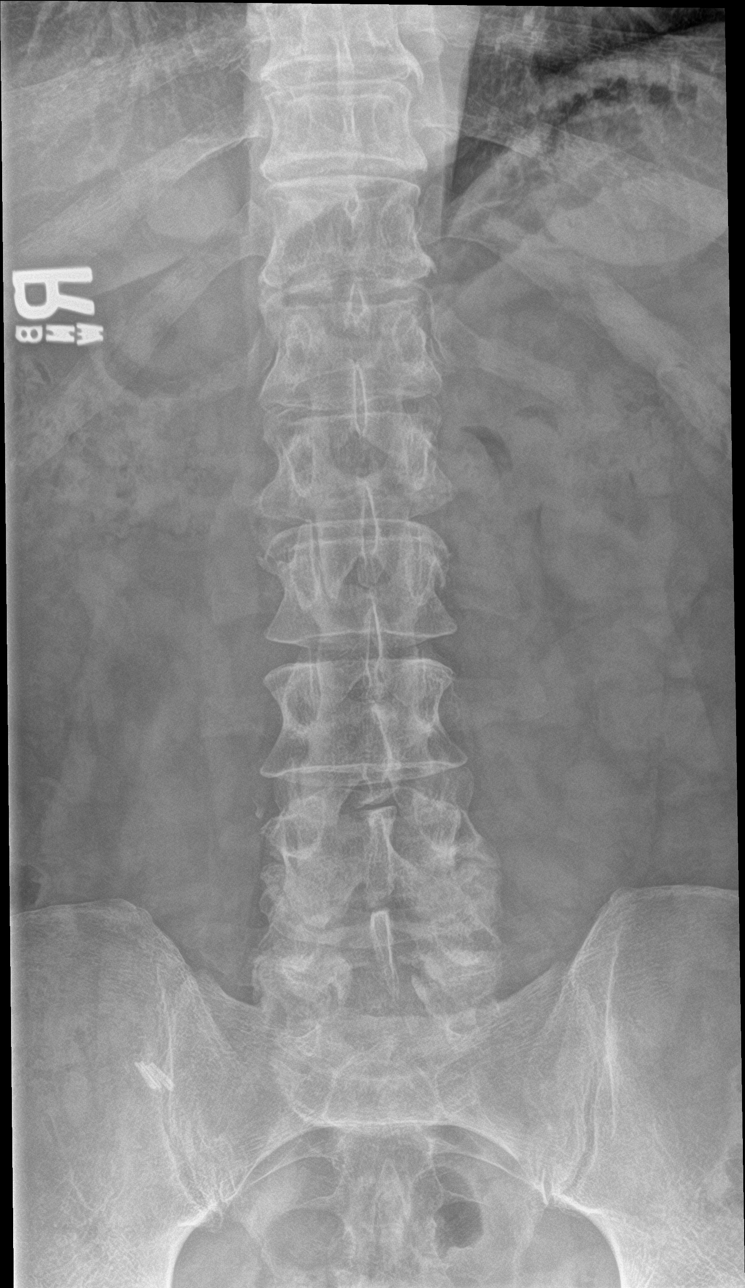

[l-spine obl (1 of 2)]
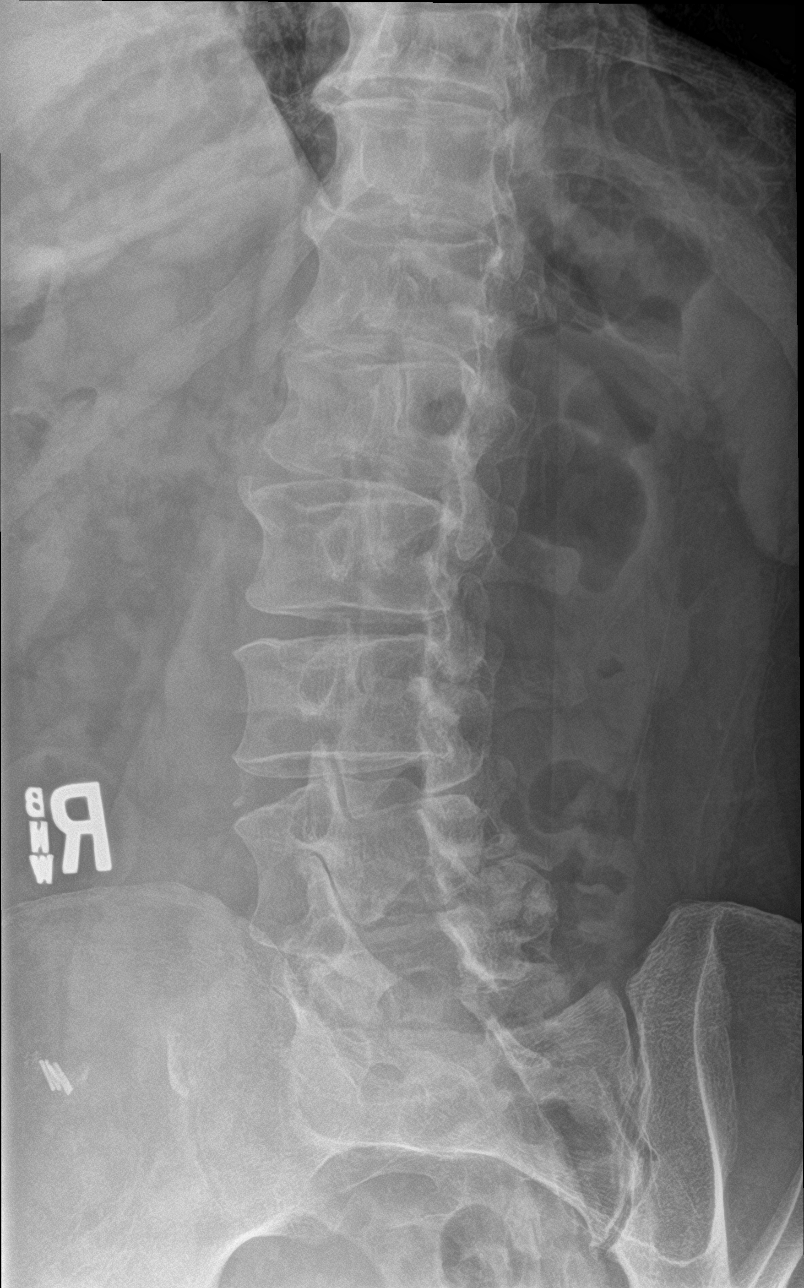

[l-spine obl (2 of 2)]
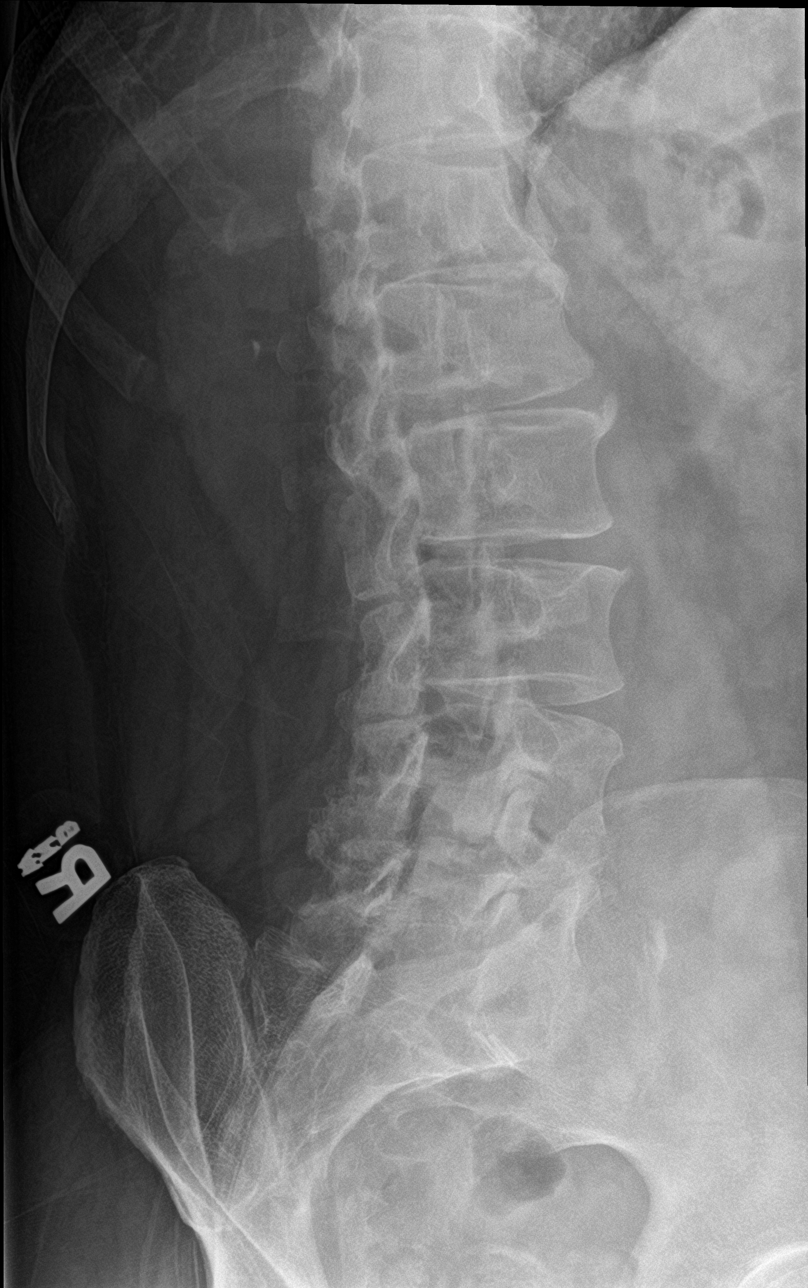

[l-spine lat]
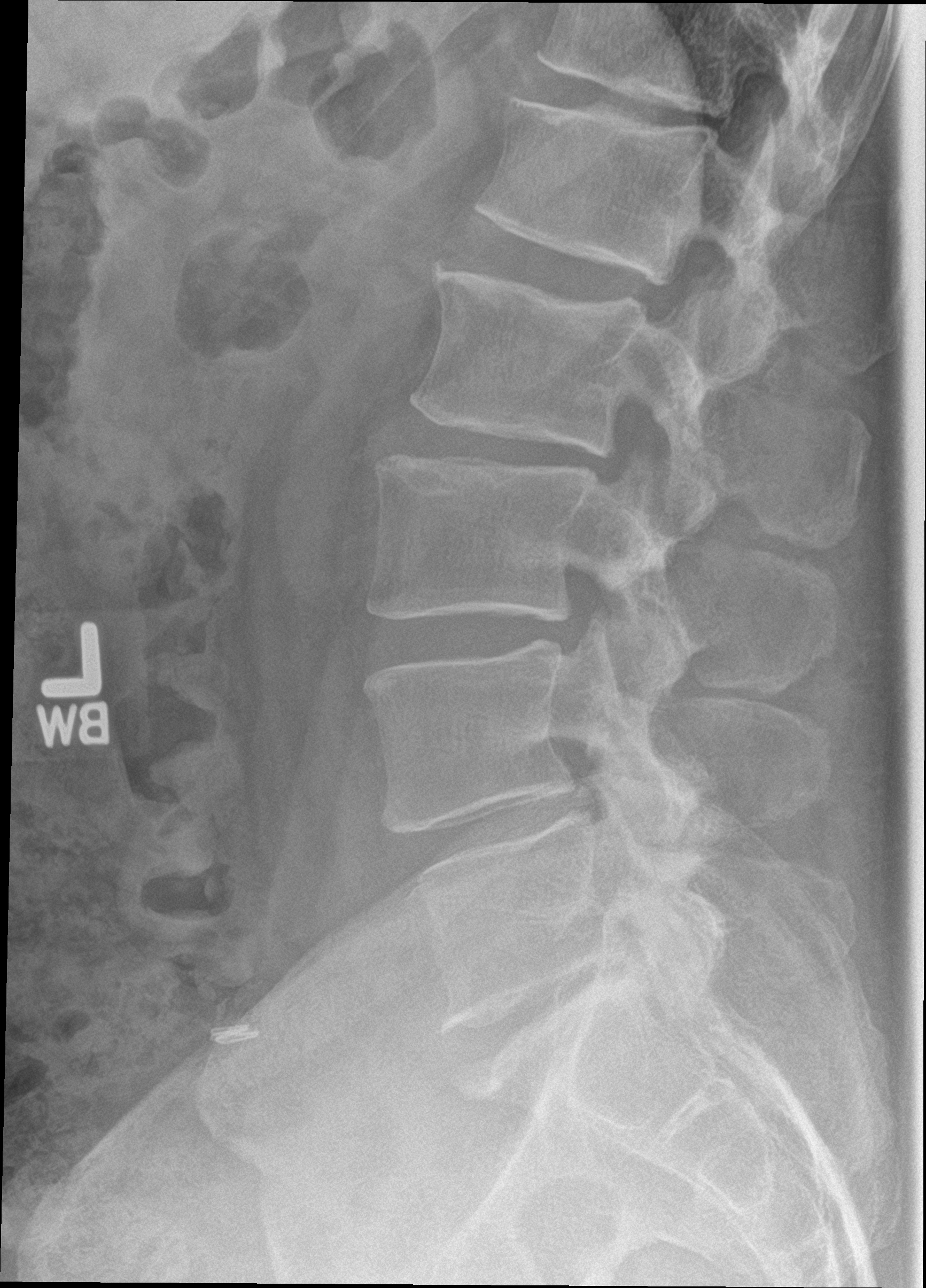

[l-spine spot]
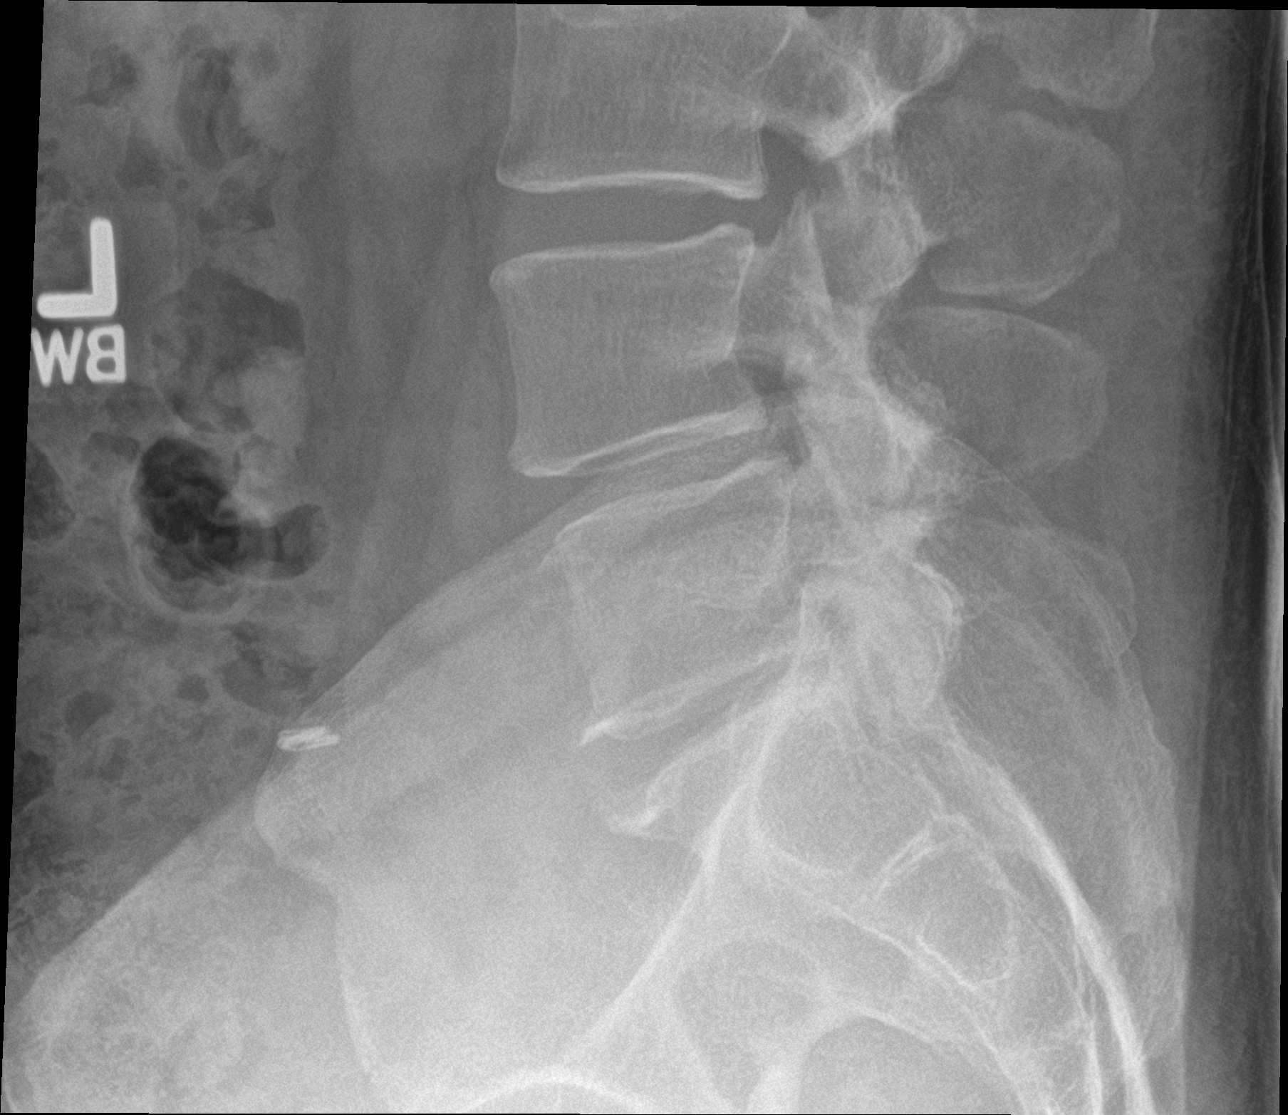

[5 of 5 positions shown; findings below may reference images not displayed]

FINDINGS: Vertebral body alignment and heights are normal. There is mild
spondylosis of the lumbar spine to include facet arthropathy over
the lower lumbar spine. Mild disc space narrowing at the L2-3, L4-5
and L5-S1 levels. No evidence of compression fracture or
spondylolisthesis. Surgical clips over the right lower quadrant.
IMPRESSION: Mild spondylosis of the lumbar spine with multilevel disc disease as
described.

## 2019-12-11 ENCOUNTER — Encounter (INDEPENDENT_AMBULATORY_CARE_PROVIDER_SITE_OTHER): Payer: Commercial Managed Care - PPO | Admitting: Internal Medicine

## 2019-12-11 ENCOUNTER — Telehealth (INDEPENDENT_AMBULATORY_CARE_PROVIDER_SITE_OTHER): Payer: Self-pay

## 2020-04-01 NOTE — Telephone Encounter (Signed)
Closed

## 2022-09-24 ENCOUNTER — Other Ambulatory Visit: Payer: Self-pay

## 2022-09-24 ENCOUNTER — Emergency Department (HOSPITAL_BASED_OUTPATIENT_CLINIC_OR_DEPARTMENT_OTHER)
Admission: EM | Admit: 2022-09-24 | Discharge: 2022-09-24 | Discharge status: Against Medical Advice | Payer: Commercial Managed Care - PPO | Attending: Emergency Medicine | Admitting: Emergency Medicine

## 2022-09-24 ENCOUNTER — Emergency Department (HOSPITAL_BASED_OUTPATIENT_CLINIC_OR_DEPARTMENT_OTHER): Payer: Commercial Managed Care - PPO | Admitting: Radiology

## 2022-09-24 ENCOUNTER — Encounter (HOSPITAL_BASED_OUTPATIENT_CLINIC_OR_DEPARTMENT_OTHER): Payer: Self-pay | Admitting: Emergency Medicine

## 2022-09-24 DIAGNOSIS — R079 Chest pain, unspecified: Secondary | ICD-10-CM | POA: Insufficient documentation

## 2022-09-24 DIAGNOSIS — Z5321 Procedure and treatment not carried out due to patient leaving prior to being seen by health care provider: Secondary | ICD-10-CM | POA: Diagnosis not present

## 2022-09-24 LAB — BASIC METABOLIC PANEL
Anion gap: 8 (ref 5–15)
BUN: 17 mg/dL (ref 6–20)
CO2: 24 mmol/L (ref 22–32)
Calcium: 8.9 mg/dL (ref 8.9–10.3)
Chloride: 107 mmol/L (ref 98–111)
Creatinine, Ser: 1.11 mg/dL (ref 0.61–1.24)
GFR, Estimated: >60 mL/min (ref >60)
Glucose, Bld: 98 mg/dL (ref 70–99)
Potassium: 3.6 mmol/L (ref 3.5–5.1)
Sodium: 139 mmol/L (ref 135–145)

## 2022-09-24 LAB — TROPONIN I (HIGH SENSITIVITY): Troponin I (High Sensitivity): 3 ng/L (ref <18)

## 2022-09-24 LAB — CBC
HCT: 44.4 % (ref 39.0–52.0)
Hemoglobin: 15.3 g/dL (ref 13.0–17.0)
MCH: 30.1 pg (ref 26.0–34.0)
MCHC: 34.5 g/dL (ref 30.0–36.0)
MCV: 87.2 fL (ref 80.0–100.0)
Platelets: 198 10*3/uL (ref 150–400)
RBC: 5.09 MIL/uL (ref 4.22–5.81)
RDW: 12 % (ref 11.5–15.5)
WBC: 7.8 10*3/uL (ref 4.0–10.5)
nRBC: 0 % (ref 0.0–0.2)

## 2022-09-24 NOTE — ED Triage Notes (Signed)
Pt presents to ED POV. Pt c/o Cp that began this evening. Pt reports that pain began at rest not long after he ate. Pt reports that pain is central and radiates to back and 1/10, describes it as pressure

## 2023-04-26 ENCOUNTER — Other Ambulatory Visit (HOSPITAL_COMMUNITY): Payer: Self-pay

## 2023-04-26 DIAGNOSIS — E7800 Pure hypercholesterolemia, unspecified: Secondary | ICD-10-CM

## 2023-04-28 ENCOUNTER — Ambulatory Visit (HOSPITAL_COMMUNITY)
Admission: RE | Admit: 2023-04-28 | Discharge: 2023-04-28 | Disposition: A | Discharge status: Home / Self Care | Payer: Self-pay | Source: Ambulatory Visit

## 2023-04-28 DIAGNOSIS — E7800 Pure hypercholesterolemia, unspecified: Secondary | ICD-10-CM | POA: Insufficient documentation

## 2024-04-07 ENCOUNTER — Encounter (INDEPENDENT_AMBULATORY_CARE_PROVIDER_SITE_OTHER): Payer: Self-pay | Admitting: *Deleted
